# Patient Record
Sex: Male | Born: 1960 | Race: White | Hispanic: No | Marital: Married | Smoking: Never smoker
Health system: Southern US, Community
[De-identification: ages and names within clinical notes are randomized; demographics above are authoritative.]

## PROBLEM LIST (undated history)

## (undated) DIAGNOSIS — T7840XA Allergy, unspecified, initial encounter: Secondary | ICD-10-CM

## (undated) DIAGNOSIS — E785 Hyperlipidemia, unspecified: Secondary | ICD-10-CM

## (undated) DIAGNOSIS — K219 Gastro-esophageal reflux disease without esophagitis: Secondary | ICD-10-CM

## (undated) HISTORY — DX: Allergy, unspecified, initial encounter: T78.40XA

## (undated) HISTORY — DX: Hyperlipidemia, unspecified: E78.5

## (undated) HISTORY — DX: Gastro-esophageal reflux disease without esophagitis: K21.9

## (undated) HISTORY — PX: ROTATOR CUFF REPAIR: SHX139

## (undated) HISTORY — PX: COLONOSCOPY: SHX174

---

## 2003-06-21 ENCOUNTER — Encounter: Payer: Self-pay | Admitting: Internal Medicine

## 2004-06-16 ENCOUNTER — Ambulatory Visit: Payer: Self-pay | Admitting: Internal Medicine

## 2004-08-19 ENCOUNTER — Ambulatory Visit: Payer: Self-pay | Admitting: Internal Medicine

## 2005-03-14 ENCOUNTER — Ambulatory Visit: Payer: Self-pay | Admitting: Family Medicine

## 2005-03-27 ENCOUNTER — Ambulatory Visit: Payer: Self-pay | Admitting: Internal Medicine

## 2005-09-21 ENCOUNTER — Ambulatory Visit: Payer: Self-pay | Admitting: Family Medicine

## 2005-09-23 ENCOUNTER — Ambulatory Visit: Payer: Self-pay | Admitting: Internal Medicine

## 2006-05-10 ENCOUNTER — Encounter: Payer: Self-pay | Admitting: Internal Medicine

## 2006-05-21 ENCOUNTER — Ambulatory Visit: Payer: Self-pay | Admitting: Allergy and Immunology

## 2006-06-09 ENCOUNTER — Encounter: Payer: Self-pay | Admitting: Internal Medicine

## 2006-08-10 ENCOUNTER — Encounter: Payer: Self-pay | Admitting: Internal Medicine

## 2006-10-23 ENCOUNTER — Ambulatory Visit: Payer: Self-pay | Admitting: Family Medicine

## 2006-10-23 ENCOUNTER — Encounter: Payer: Self-pay | Admitting: Internal Medicine

## 2007-12-08 ENCOUNTER — Ambulatory Visit: Payer: Self-pay | Admitting: Family Medicine

## 2007-12-08 DIAGNOSIS — H60399 Other infective otitis externa, unspecified ear: Secondary | ICD-10-CM | POA: Insufficient documentation

## 2007-12-08 DIAGNOSIS — H6690 Otitis media, unspecified, unspecified ear: Secondary | ICD-10-CM | POA: Insufficient documentation

## 2008-02-15 ENCOUNTER — Ambulatory Visit: Payer: Self-pay | Admitting: Internal Medicine

## 2008-02-15 DIAGNOSIS — R109 Unspecified abdominal pain: Secondary | ICD-10-CM | POA: Insufficient documentation

## 2008-02-16 ENCOUNTER — Encounter: Payer: Self-pay | Admitting: Internal Medicine

## 2008-02-16 LAB — CONVERTED CEMR LAB
Albumin: 4.2 g/dL (ref 3.5–5.2)
Alkaline Phosphatase: 70 units/L (ref 39–117)
BUN: 15 mg/dL (ref 6–23)
Basophils Absolute: 0 10*3/uL (ref 0.0–0.1)
Bilirubin, Direct: 0.1 mg/dL (ref 0.0–0.3)
CO2: 31 meq/L (ref 19–32)
Chloride: 107 meq/L (ref 96–112)
Creatinine, Ser: 1 mg/dL (ref 0.4–1.5)
Eosinophils Absolute: 0.2 10*3/uL (ref 0.0–0.7)
GFR calc non Af Amer: 86 mL/min
Lipase: 21 units/L (ref 11.0–59.0)
MCHC: 34 g/dL (ref 30.0–36.0)
MCV: 89.3 fL (ref 78.0–100.0)
Neutrophils Relative %: 55.3 % (ref 43.0–77.0)
Platelets: 213 10*3/uL (ref 150–400)
Potassium: 4.2 meq/L (ref 3.5–5.1)

## 2008-05-11 ENCOUNTER — Ambulatory Visit: Payer: Self-pay | Admitting: Internal Medicine

## 2008-05-11 DIAGNOSIS — K2970 Gastritis, unspecified, without bleeding: Secondary | ICD-10-CM | POA: Insufficient documentation

## 2008-05-11 DIAGNOSIS — E785 Hyperlipidemia, unspecified: Secondary | ICD-10-CM | POA: Insufficient documentation

## 2008-05-11 DIAGNOSIS — K2990 Gastroduodenitis, unspecified, without bleeding: Secondary | ICD-10-CM

## 2008-05-11 LAB — CONVERTED CEMR LAB
Glucose, Urine, Semiquant: NEGATIVE
Specific Gravity, Urine: 1.015
Urobilinogen, UA: 0.2
WBC Urine, dipstick: NEGATIVE
pH: 6

## 2008-06-21 ENCOUNTER — Ambulatory Visit: Payer: Self-pay | Admitting: Internal Medicine

## 2008-06-21 DIAGNOSIS — K219 Gastro-esophageal reflux disease without esophagitis: Secondary | ICD-10-CM

## 2008-06-29 ENCOUNTER — Ambulatory Visit: Payer: Self-pay | Admitting: Family Medicine

## 2008-06-29 DIAGNOSIS — M25519 Pain in unspecified shoulder: Secondary | ICD-10-CM

## 2008-06-29 DIAGNOSIS — M719 Bursopathy, unspecified: Secondary | ICD-10-CM

## 2008-06-29 DIAGNOSIS — M67919 Unspecified disorder of synovium and tendon, unspecified shoulder: Secondary | ICD-10-CM | POA: Insufficient documentation

## 2008-08-03 ENCOUNTER — Ambulatory Visit: Payer: Self-pay | Admitting: Family Medicine

## 2008-10-25 ENCOUNTER — Ambulatory Visit (HOSPITAL_BASED_OUTPATIENT_CLINIC_OR_DEPARTMENT_OTHER): Admission: RE | Admit: 2008-10-25 | Discharge: 2008-10-25 | Payer: Self-pay | Admitting: Orthopedic Surgery

## 2009-01-24 IMAGING — CT CT PARANASAL SINUSES W/O CM
1 series · 16 of 24 positions shown, 20 images · non-contrast
Comparison: none

REASON FOR EXAM: chronic sinusitis
COMMENTS:

[Series 2: sinus · axial · 0.20mm/px · z∈[-113,-8]mm · 16 of 24 slices shown, 20 images]
[im 2/24  brain]
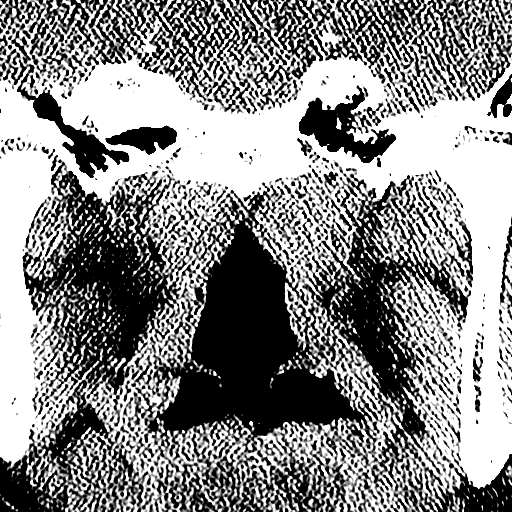
[im 2/24  bone]
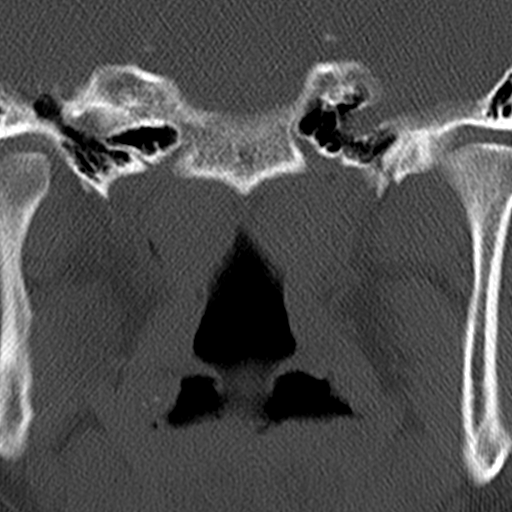
[im 4/24  bone]
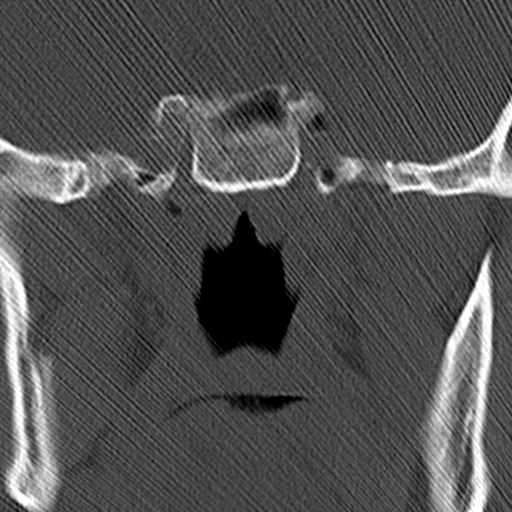
[im 5/24  bone]
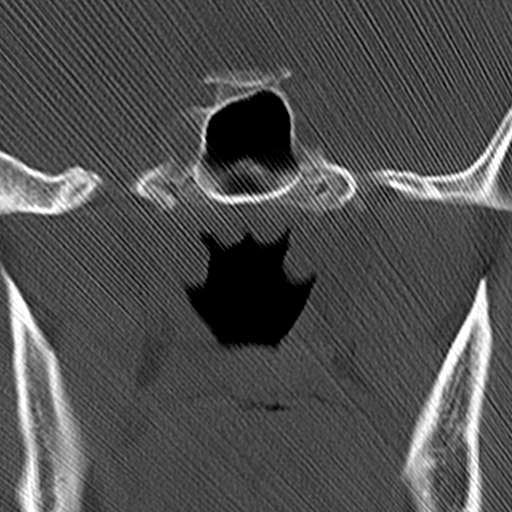
[im 6/24  bone]
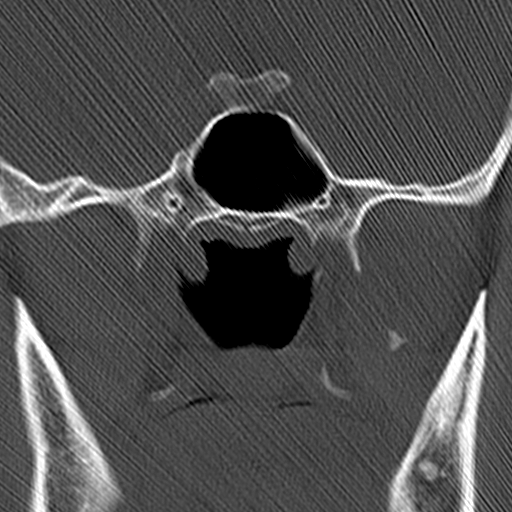
[im 8/24  brain]
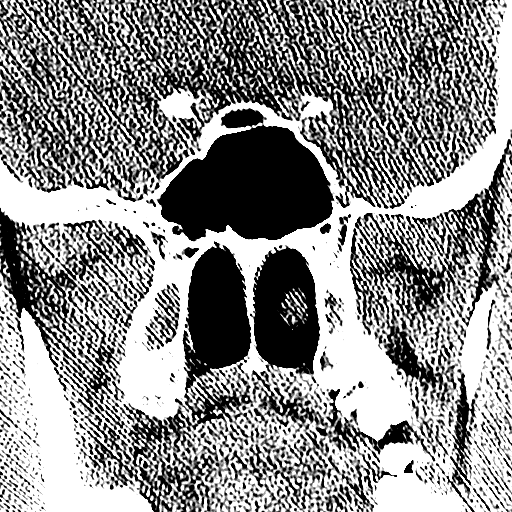
[im 8/24  bone]
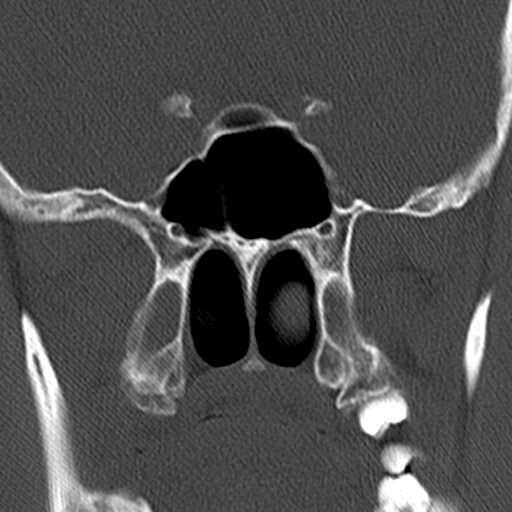
[im 9/24  bone]
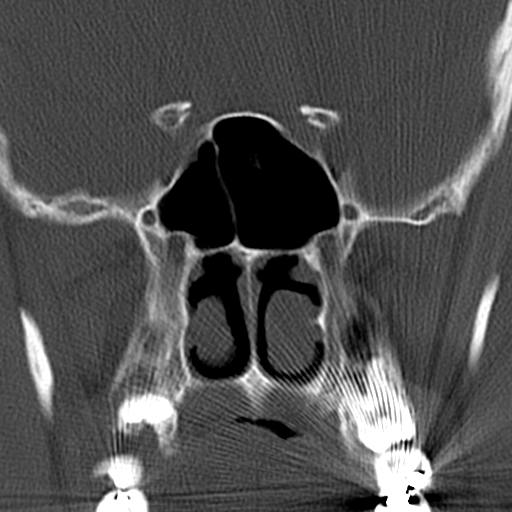
[im 10/24  bone]
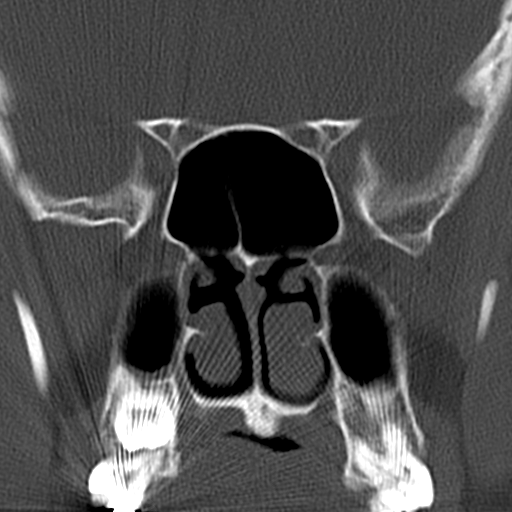
[im 12/24  bone]
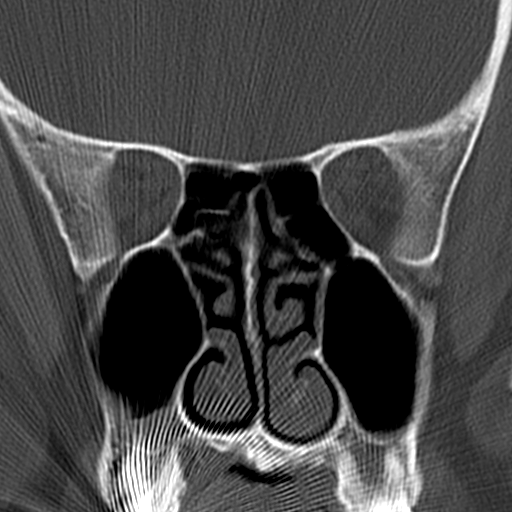
[im 13/24  brain]
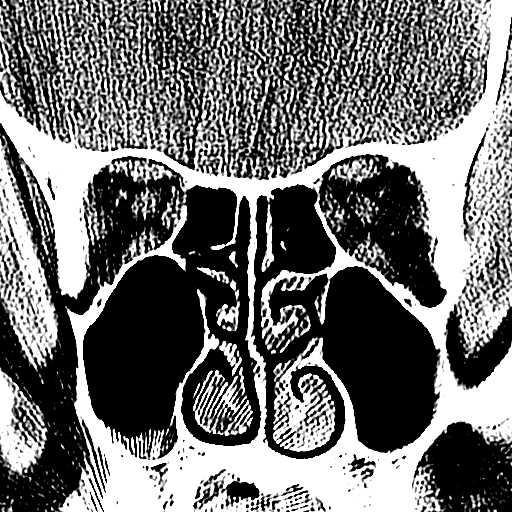
[im 13/24  bone]
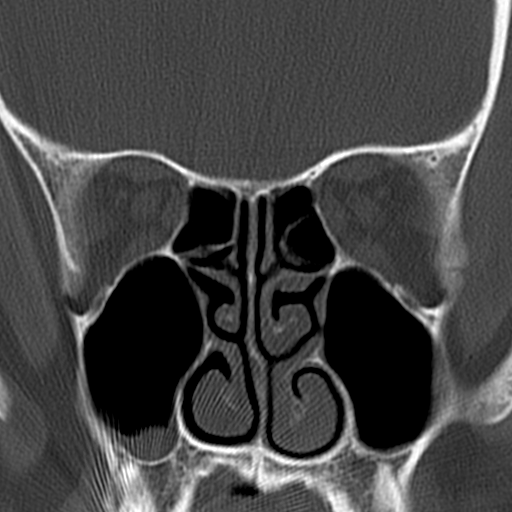
[im 15/24  bone]
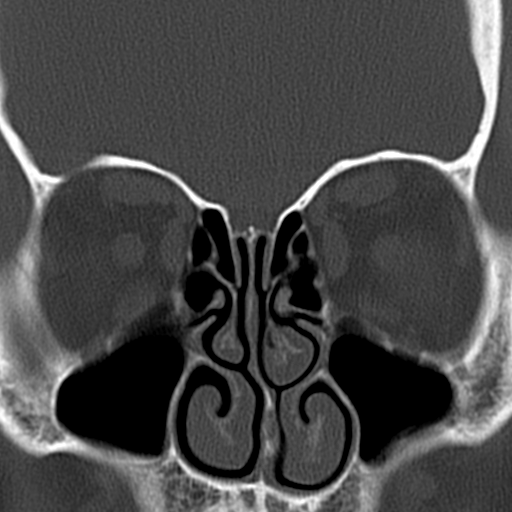
[im 16/24  bone]
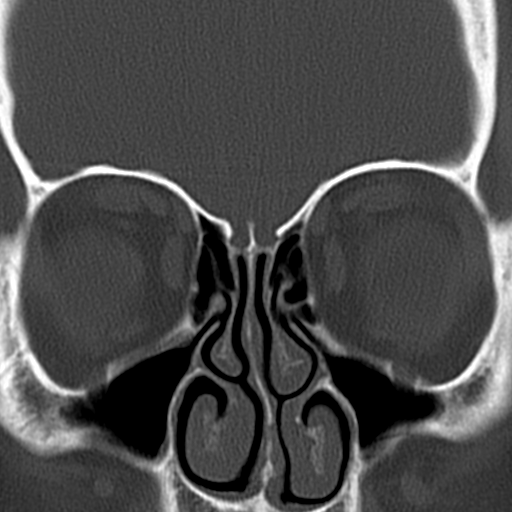
[im 17/24  bone]
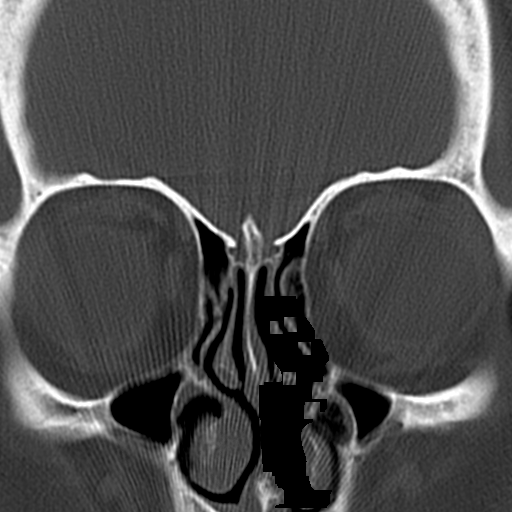
[im 19/24  brain]
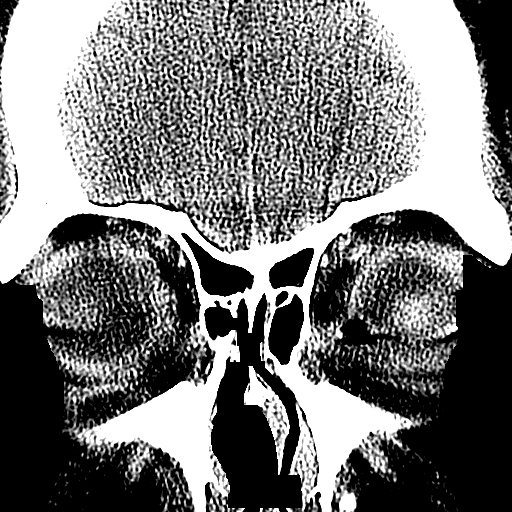
[im 19/24  bone]
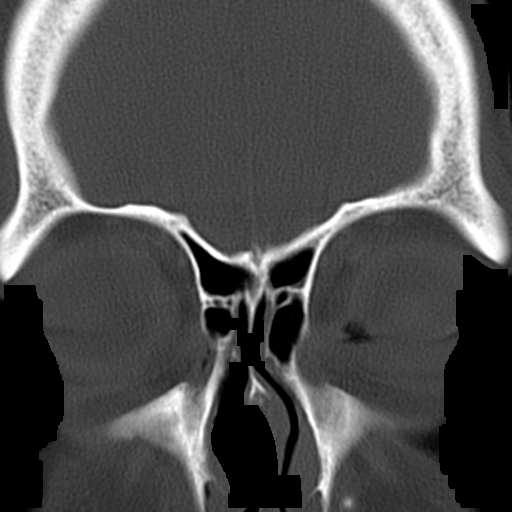
[im 20/24  bone]
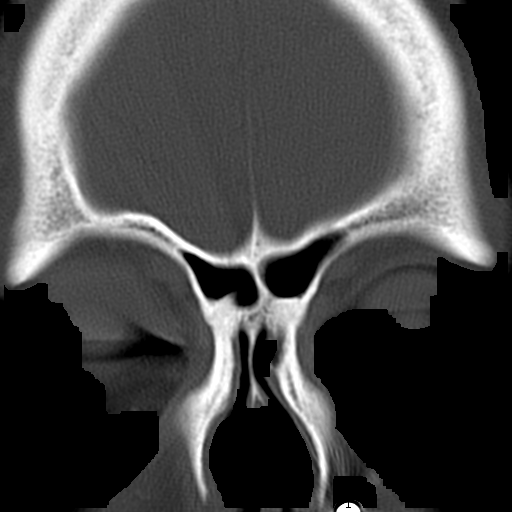
[im 21/24  bone]
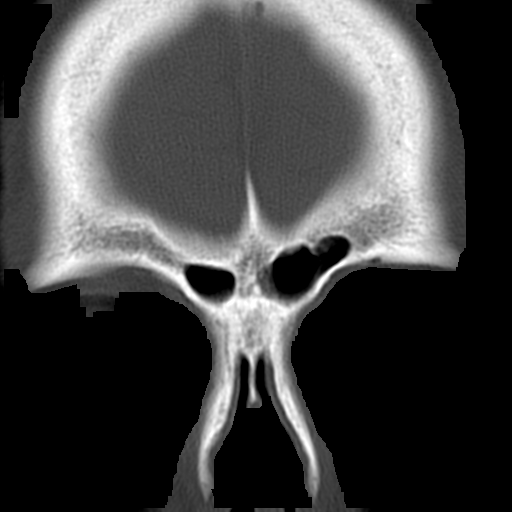
[im 23/24  bone]
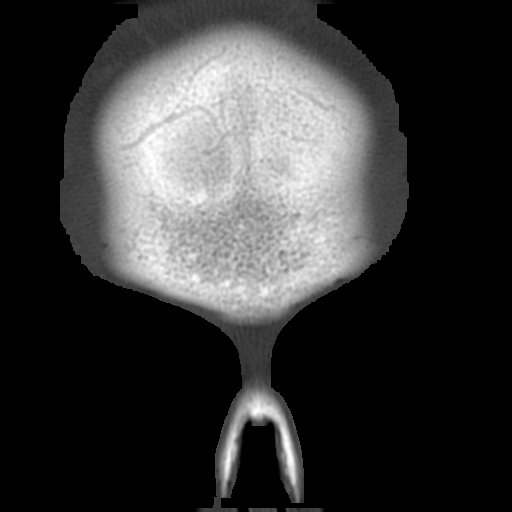

[16 of 24 positions shown; findings below may reference images not displayed]

PROCEDURE:     CT  - CT SINUSES WITHOUT CONTRAST  - May 21, 2006  [DATE]

RESULT:

Coronal images were obtained of the paranasal sinuses.

The paranasal sinuses are well aerated. An area of low attenuation projects
within the base of the RIGHT maxillary sinus which has a somewhat bulbous
configuration likely representing a mucous retention cyst or possibly a
polyp. Otherwise there is no evidence of mucosal thickening. The ostiomeatal
complexes are patent and the turbinates appear unremarkable.
IMPRESSION: Findings likely representing the sequela of a mucous retention cyst or polyp
within the RIGHT maxillary sinus. Otherwise no CT evidence to suggest
sinusitis.

## 2010-05-07 ENCOUNTER — Ambulatory Visit (INDEPENDENT_AMBULATORY_CARE_PROVIDER_SITE_OTHER): Payer: BC Managed Care – PPO | Admitting: Family Medicine

## 2010-05-07 ENCOUNTER — Encounter: Payer: Self-pay | Admitting: Family Medicine

## 2010-05-07 DIAGNOSIS — J209 Acute bronchitis, unspecified: Secondary | ICD-10-CM

## 2010-05-08 ENCOUNTER — Telehealth: Payer: Self-pay | Admitting: Family Medicine

## 2010-05-13 NOTE — Progress Notes (Signed)
Summary: ? pink eye  Phone Note Call from Patient Call back at Home Phone [phone]   Caller: Cathie Olden Call For: Dr. Sharen Hones Summary of Call: Pt was seen yesterday.  Today he has developed ? conjunctivitis- eye is red, itchy, crusty since this morning.  Wife is asking if something can be called in or does pt need to be seen again?  Uses [facility]. Initial call taken by: Lowella Petties CMA, AAMA,  May 08, 2010 11:08 AM  Follow-up for Phone Call        ensure no pain with eye movements, no vision changes.   sounds like viral conjunctivitis.  recommend warm compresses to eye three times a day as well as lubricating eye drops (otc).   if not better in 2-3 days or worsening discharge, can fill abx eye drop to be sent in. Follow-up by: Eustaquio Boyden  MD,  May 08, 2010 12:56 PM  Additional Follow-up for Phone Call Additional follow up Details #1::        Left message on home machine to return my call. No answer on work #. Kim Dance CMA Duncan Dull)  May 08, 2010 2:21 PM   Pt instructed as above, drops sent to [facility] pharmacy. Additional Follow-up by: Lowella Petties CMA, AAMA,  May 08, 2010 3:54 PM    New/Updated Medications: POLYTRIM 10000-0.1 UNIT/ML-% SOLN (POLYMYXIN B-TRIMETHOPRIM) one drop to affected eye qid x 5 days Prescriptions: POLYTRIM 10000-0.1 UNIT/ML-% SOLN (POLYMYXIN B-TRIMETHOPRIM) one drop to affected eye qid x 5 days  #1 x 0   Entered and Authorized by:   Eustaquio Boyden  MD   Signed by:   Lowella Petties CMA, AAMA on 05/08/2010   Method used:   Electronically to        [facility]* (retail)       [address]       [place], [place]  [address]       Ph: [phone]       Fax: [phone]   RxID:   [id number]

## 2010-05-13 NOTE — Assessment & Plan Note (Signed)
Summary: COUGH, CHEST IS TIGHT   Vital Signs:  Patient profile:   50 year old male Weight:      170.25 pounds BMI:     26.76 Temp:     99.4 degrees F oral Pulse rate:   80 / minute Pulse rhythm:   regular BP sitting:   110 / 70  (left arm) Cuff size:   regular  Vitals Entered By: Selena Batten Dance CMA (AAMA) (May 07, 2010 9:26 AM) CC: Cough,sinus pressure,chest tightness with coughing Comments OTC meds no help   History of Present Illness: CC: cough, sinus pressure  4d h/o sinus pressure, cough, vomiting (phlegm, posttussive) with dry heaves.  Now chest sore with coughing.  taking expectorant which helps sputum production as well as OTC multisxs daytime medicine.  Thinks getting better.  + ST as well.  + low grade fever, now resolved.  + sinus pressure pain behind eyes.  + L ear pain as well.  Cough productive of mild sputum that started this morning.  No abd pain, n/d, rashes, myalgias, arthralgias.    illness went through the house.  No smokers at home, no h/o asthma.  thinks may be getting better.  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 No apparent alopecia or balding.  no sinus tenderness Eyes:  PERRLA, EOMI, no injection Ears:  TMs clear bilaterally Nose:  + discharge bilateral nostrils Mouth:  MMM, no pharyngeal erythema Neck:  supple, no masses, no thyromegaly, no carotid bruits, and no cervical lymphadenopathy.   Lungs:  Normal  respiratory effort, chest expands symmetrically. Lungs are clear to auscultation, no crackles or wheezes. Heart:  normal rate, regular rhythm, no murmur, and no gallop.   Pulses:  2+ rad pulses, brisk cap refill Extremities:  no pedal edema   Impression & Recommendations:  Problem # 1:  ACUTE BRONCHITIS (ICD-466.0) with posttussive emesis.  as seems to be improving, suportive care for now.  pt declines cough med.  advised to call us if deteriorating or if not improving by friday for consideration of abx (cover atypicals.)  Patient Instructions: 1)  Sounds like you have a viral upper respiratory infection. 2)  Antibiotics are not needed for this.  Viral infections usually take 7-10 days to resolve.  The cough can last 4 weeks to go away. 3)  Continue plenty of fluids and rest. 4)  If not better into Friday, give [place] a call. 5)  Continue expectorant, consider nasal saline spray for sinuses. 6)  Please return if you are not improving as expected, or if you have high fevers (>101.5) or difficulty swallowing. 7)  Call clinic with questions.  Pleasure to see you today!    Orders Added: 1)  Est. Patient Level III [25366]    Prior Medications: Current Allergies (reviewed today): No known allergies

## 2010-06-22 LAB — POCT HEMOGLOBIN-HEMACUE: Hemoglobin: 17.7 g/dL — ABNORMAL HIGH (ref 13.0–17.0)

## 2010-07-29 NOTE — Op Note (Signed)
NAME:  Matthew Walter, FRISCH                 ACCOUNT NO.:  [id number]   MEDICAL RECORD NO.:  [id number]          PATIENT TYPE:  AMB   LOCATION:  DSC                          FACILITY:  [facility]   PHYSICIAN:  Rodney A. Mortenson, M.D.DATE OF BIRTH:  1960-08-01   DATE OF PROCEDURE:  10/25/2008  DATE OF DISCHARGE:                               OPERATIVE REPORT   JUSTIFICATION:  A 50 year old male referred to me by Dr. Crista Luria  from [facility] for an injury he sustained to his  right shoulder in September 27, 2008 when he was lifting and pushing a box.  Because of persistent pain and discomfort, he was noted to have some  tenderness of the [facility] joint, cross-arm test, empty can test was positive,  impingement test is very painful.  Because of persistent pain and  discomfort, an MRI of the shoulder was done and this shows severe  osteoarthritis of the [facility] joint with undersurface spurring with mass  effects of supraspinatus and nearly complete tear of the supraspinatus  of 2 cm of retraction.  Because of persistent pain and discomfort which  was not responding, the patient was scheduled for surgery and  reconstruction.  Complication was discussed preoperatively.  Questions  were answered and encouraged and the patient wishes to proceed.   JUSTIFICATION FOR OUTPATIENT SURGERY:  Minimal morbidity.   PREOPERATIVE DIAGNOSIS:  Full-thickness rotator cuff tear of right  shoulder; osteoarthritis of right acromioclavicular joint.   POSTOPERATIVE DIAGNOSIS:  Full-thickness rotator cuff tear of right  shoulder; osteoarthritis of right acromioclavicular joint.   OPERATION:  Diagnostic arthroscopy of right shoulder; open subacromial  decompression; open repair of retracted rotator cuff tear, right  shoulder; resection of right distal clavicle.   SURGEON:  Lenard Galloway. Chaney Malling, MD   ASSISTANT:  Oris Drone. Petrarca, PA-C   PROCEDURE:  The patient was placed on the operating table in supine  position.  After satisfactory general anesthesia, the patient was placed  in semi-sitting position.  The right upper extremity was prepped with  DuraPrep and draped down in the usual manner.  The arthroscope was  introduced in the posterior portal and anterior portal was made for  surgical approach.  A very careful examination of the joint was  undertaken.  The glenoid appeared normal as did the humeral head.  There  was normal articular cartilage.  The labrum was intact.  The anchor was  intact.  The biceps appeared normal.  Following the biceps as it exit  the shoulder.  There was a large full-thickness tear of the rotator  cuff.  The arthroscope could be passed through the tear into the  subacromial space and the undersurface of the acromion was clearly  visualized.  There was retraction of the tear and this was actually very  thick portion and not frayed or fibrillated.  No other significant  pathology seen in glenohumeral joint and the arthroscope was removed.   A saber-cut incision made over the anterolateral aspect of the shoulder.  Skin edges were retracted.  Bleeders were coagulated.  Deltoid fibers  were split longitudinally  with a finger and a self-retaining retractor  was put in place.  There was a very large thickened bursa, which was  excised and there was exposed rotator cuff tear.  The tear was about a  centimeter and half wide and retracted about a centimeter.  The normal  footprint could be seen.  A rongeur was used to freshen up the  footprint.  The cuff tear was quite mobile, can be brought down into an  anatomic position.  A PEEK anchor system was then used and a hole was  drilled in the humeral head and a 5.5 PEEK anchor inserted and two loops  of the FiberWire were brought through the distal tendinous portion and  the tendon brought down to the footprint and sutured down extremely  well.  There was an excellent complete coverage of the footprint with  good  stability.  The ends of the FiberWire were preserved and needles  were removed.  A PushLock was then used and another hole was placed  distal to the PEEK anchor and the ends of the FiberWire were brought  through the PushLock and this was driven down into the humerus and that  was brought the last portion of the rotator cuff down into an anatomic  position with a very smooth transition.  I was very pleased with the  surgical construct, stability, the range of motion, and the lack of  impingement.  An anterior acromioplasty had been done as we were getting  into the shoulder and range of motion showed the repair cleared very  nicely with no impingement.  Throughout the procedure, the shoulder was  irrigated with copious amounts of saline solution.  Attention was then  turned to the [facility] joint, which was identified with an 18-gauge needle.  The [facility] joint was opened and the distal end of clavicle was markedly  eroded and had a cobblestone appearance.  A power saw was used in about  8 mm of distal clavicle where it was resected.  Great care was taken to  avoid any injury to the adjacent inferior capsule or ligaments.  It was  quite stable once the portion of the clavicle was removed.  Bleeders  were coagulated.  The soft tissue capsule was then involuted into the  resected area and sutured with 0 Vicryl.  The 0 Vicryl was then used to  approximate the deltoid fibers together.  Subcutaneous Vicryl sutures  were used and Steri-Strips was used to close the skin.  Sterile  dressings were applied and the patient was then returned recovery room  in excellent condition.  Technically, this went extremely well.   DISPOSITION:  1. Usual postop instructions.  2. Sling.  3. Percocet for pain.  4. To my office on Wednesday.      Rodney A. Chaney Malling, M.D.  Electronically Signed     RAM/MEDQ  D:  10/25/2008  T:  10/25/2008  Job:  [id number]   cc:   Sharlet Salina, M.D.

## 2012-09-13 ENCOUNTER — Ambulatory Visit (INDEPENDENT_AMBULATORY_CARE_PROVIDER_SITE_OTHER): Payer: BC Managed Care – PPO | Admitting: Internal Medicine

## 2012-09-13 ENCOUNTER — Encounter: Payer: Self-pay | Admitting: Internal Medicine

## 2012-09-13 VITALS — BP 112/78 | HR 79 | Temp 98.5°F | Ht 67.5 in | Wt 172.0 lb

## 2012-09-13 DIAGNOSIS — E785 Hyperlipidemia, unspecified: Secondary | ICD-10-CM

## 2012-09-13 DIAGNOSIS — Z125 Encounter for screening for malignant neoplasm of prostate: Secondary | ICD-10-CM

## 2012-09-13 DIAGNOSIS — Z0000 Encounter for general adult medical examination without abnormal findings: Secondary | ICD-10-CM

## 2012-09-13 DIAGNOSIS — Z1211 Encounter for screening for malignant neoplasm of colon: Secondary | ICD-10-CM

## 2012-09-13 LAB — BASIC METABOLIC PANEL
BUN: 14 mg/dL (ref 6–23)
CO2: 26 mEq/L (ref 19–32)
Chloride: 103 mEq/L (ref 96–112)
Glucose, Bld: 76 mg/dL (ref 70–99)
Potassium: 4.3 mEq/L (ref 3.5–5.1)
Sodium: 138 mEq/L (ref 135–145)

## 2012-09-13 LAB — CBC WITH DIFFERENTIAL/PLATELET
Basophils Absolute: 0 10*3/uL (ref 0.0–0.1)
HCT: 51.2 % (ref 39.0–52.0)
Hemoglobin: 17.1 g/dL — ABNORMAL HIGH (ref 13.0–17.0)
Lymphs Abs: 2.8 10*3/uL (ref 0.7–4.0)
MCHC: 33.5 g/dL (ref 30.0–36.0)
MCV: 89.4 fl (ref 78.0–100.0)
Monocytes Absolute: 0.8 10*3/uL (ref 0.1–1.0)
Neutro Abs: 5.5 10*3/uL (ref 1.4–7.7)
Platelets: 215 10*3/uL (ref 150.0–400.0)
RDW: 14.1 % (ref 11.5–14.6)

## 2012-09-13 LAB — HEPATIC FUNCTION PANEL
Albumin: 4.6 g/dL (ref 3.5–5.2)
Alkaline Phosphatase: 82 U/L (ref 39–117)
Total Protein: 7.7 g/dL (ref 6.0–8.3)

## 2012-09-13 LAB — PSA: PSA: 1.75 ng/mL (ref 0.10–4.00)

## 2012-09-13 LAB — TSH: TSH: 1.54 u[IU]/mL (ref 0.35–5.50)

## 2012-09-13 LAB — LIPID PANEL: HDL: 46.7 mg/dL (ref >39.00)

## 2012-09-13 NOTE — Assessment & Plan Note (Signed)
Healthy Will do every 2 year PSA after discussion colonoscopy

## 2012-09-13 NOTE — Assessment & Plan Note (Signed)
Haven't checked in some time Will check labs

## 2012-09-13 NOTE — Progress Notes (Signed)
Subjective:    Patient ID: Matthew Walter, male    DOB: 1960-06-08, 52 y.o.   MRN: [id number]  HPI Here for physical Has been doing well Had both rotator cuffs repaired in past 4 years---they are doing well  Active at work Not much exercise otherwise---- busy with kids  No concerns  No current outpatient prescriptions on file prior to visit.   No current facility-administered medications on file prior to visit.    No Known Allergies  Past Medical History  Diagnosis Date  . Hyperlipidemia   . GERD (gastroesophageal reflux disease)     Past Surgical History  Procedure Laterality Date  . Rotator cuff repair Bilateral 2010--R, 2012-- L    Dr Chaney Malling and then Dr Thomasena Edis    Family History  Problem Relation Age of Onset  . Healthy Mother   . Cancer Maternal Grandmother   . Cancer Maternal Grandfather   . Hypertension Neg Hx   . Diabetes Neg Hx     History   Social History  . Marital Status: Married    Spouse Name: N/A    Number of Children: 4  . Years of Education: N/A   Occupational History  . Drives for [employer]    Social History Main Topics  . Smoking status: Never Smoker   . Smokeless tobacco: Never Used  . Alcohol Use: Yes  . Drug Use: No  . Sexually Active: Not on file   Other Topics Concern  . Not on file   Social History Narrative  . No narrative on file   Review of Systems  Constitutional: Negative for fatigue and unexpected weight change.       Wears seat belt  HENT: Positive for congestion and rhinorrhea. Negative for hearing loss, dental problem and tinnitus.        Regular with dentist   Eyes: Negative for visual disturbance.       No diplopia or unilateral vision change  Respiratory: Negative for cough, chest tightness and shortness of breath.   Cardiovascular: Negative for chest pain, palpitations and leg swelling.  Gastrointestinal: Positive for constipation. Negative for nausea, vomiting, abdominal pain and blood in stool.       Occ  heartburn if eats wrong-- maalox will help Constipation if eating too much fast food  Endocrine: Negative for cold intolerance and heat intolerance.  Genitourinary: Positive for difficulty urinating. Negative for urgency and frequency.       Occ delay in initiation Occ dribbling No regular nocturia No sexual problems  Musculoskeletal: Positive for back pain. Negative for joint swelling and arthralgias.       Sees chiropractor twice a month for chronic back problems ([place])  Skin: Positive for rash.       Gets occasional acne on face No suspicious lesions Sees Dr Terri Piedra regularly  Allergic/Immunologic: Positive for environmental allergies. Negative for immunocompromised state.       Saw Dr Gary Fleet--- uses zyrtec prn  Neurological: Positive for headaches. Negative for dizziness, syncope, weakness, light-headedness and numbness.       Occ sinus headaches--- will use advil with success  Hematological: Negative for adenopathy. Does not bruise/bleed easily.  Psychiatric/Behavioral: Negative for sleep disturbance and dysphoric mood. The patient is not nervous/anxious.        Objective:   Physical Exam  Constitutional: He is oriented to person, place, and time. He appears well-developed and well-nourished. No distress.  HENT:  Head: Normocephalic and atraumatic.  Right Ear: External ear normal.  Left  Ear: External ear normal.  Mouth/Throat: Oropharynx is clear and moist. No oropharyngeal exudate.  Eyes: Conjunctivae and EOM are normal. Pupils are equal, round, and reactive to light.  Neck: Normal range of motion. Neck supple. No thyromegaly present.  Cardiovascular: Normal rate, regular rhythm, normal heart sounds and intact distal pulses.  Exam reveals no gallop.   No murmur heard. Pulmonary/Chest: Effort normal and breath sounds normal. No respiratory distress. He has no wheezes. He has no rales.  Abdominal: Soft. There is no tenderness.  Musculoskeletal: He exhibits no edema and no  tenderness.  Lymphadenopathy:    He has no cervical adenopathy.  Neurological: He is alert and oriented to person, place, and time.  Skin: No rash noted. No erythema.  Psychiatric: He has a normal mood and affect. His behavior is normal.          Assessment & Plan:

## 2012-09-14 ENCOUNTER — Encounter: Payer: Self-pay | Admitting: *Deleted

## 2012-09-20 ENCOUNTER — Encounter: Payer: Self-pay | Admitting: Internal Medicine

## 2012-12-30 ENCOUNTER — Encounter: Payer: Self-pay | Admitting: Internal Medicine

## 2013-01-19 ENCOUNTER — Other Ambulatory Visit: Payer: Self-pay

## 2013-03-13 ENCOUNTER — Ambulatory Visit (AMBULATORY_SURGERY_CENTER): Payer: BC Managed Care – PPO

## 2013-03-13 VITALS — Ht 67.0 in | Wt 172.0 lb

## 2013-03-13 DIAGNOSIS — Z1211 Encounter for screening for malignant neoplasm of colon: Secondary | ICD-10-CM

## 2013-03-13 MED ORDER — MOVIPREP 100 G PO SOLR: 1.0000 | Freq: Once | ORAL | Status: DC

## 2013-03-14 ENCOUNTER — Encounter: Payer: Self-pay | Admitting: Internal Medicine

## 2013-03-20 ENCOUNTER — Ambulatory Visit (AMBULATORY_SURGERY_CENTER): Payer: BC Managed Care – PPO | Admitting: Internal Medicine

## 2013-03-20 ENCOUNTER — Encounter: Payer: Self-pay | Admitting: Internal Medicine

## 2013-03-20 VITALS — BP 121/74 | HR 64 | Temp 98.2°F | Resp 16 | Ht 67.0 in | Wt 172.0 lb

## 2013-03-20 DIAGNOSIS — D126 Benign neoplasm of colon, unspecified: Secondary | ICD-10-CM

## 2013-03-20 DIAGNOSIS — Z1211 Encounter for screening for malignant neoplasm of colon: Secondary | ICD-10-CM

## 2013-03-20 MED ORDER — SODIUM CHLORIDE 0.9 % IV SOLN: 500.0000 mL | INTRAVENOUS | Status: DC

## 2013-03-20 NOTE — Op Note (Signed)
[facility]  [address] [place], [id number]   COLONOSCOPY PROCEDURE REPORT  PATIENT: Matthew Walter, Metzgar  MR#: [id number] BIRTHDATE: [birth year] , 52  yrs. old GENDER: Male ENDOSCOPIST: Eustace Quail, MD REFERRED TZ:GYFVCBS Silvio Pate, M.D. PROCEDURE DATE:  03/20/2013 PROCEDURE:   Colonoscopy with snare polypectomy x 1 First Screening Colonoscopy - Avg.  risk and is 50 yrs.  old or older Yes.  Prior Negative Screening - Now for repeat screening. N/A  History of Adenoma - Now for follow-up colonoscopy & has been > or = to 3 yrs.  N/A  Polyps Removed Today? Yes. ASA CLASS:   Class I INDICATIONS:average risk screening. MEDICATIONS: MAC sedation, administered by CRNA and propofol (Diprivan) 380mg  IV  DESCRIPTION OF PROCEDURE:   After the risks benefits and alternatives of the procedure were thoroughly explained, informed consent was obtained.  A digital rectal exam revealed no abnormalities of the rectum.   The LB [id number]  endoscope was introduced through the anus and advanced to the cecum, which was identified by both the appendix and ileocecal valve. No adverse events experienced.   The quality of the prep was excellent, using MoviPrep  The instrument was then slowly withdrawn as the colon was fully examined.    COLON FINDINGS: A diminutive polyp was found in the ascending colon. A polypectomy was performed with a cold snare.  The resection was complete and the polyp tissue was completely retrieved.   The colon was otherwise normal.  There was no diverticulosis, inflammation,other polyps or cancers .  Retroflexed views revealed internal hemorrhoids. The time to cecum=3 minutes 53 seconds. Withdrawal time=11 minutes 04 seconds.  The scope was withdrawn and the procedure completed. COMPLICATIONS: There were no complications.  ENDOSCOPIC IMPRESSION: 1.   Diminutive polyp was found in the ascending colon; polypectomy was performed with a cold snare 2.    The colon was otherwise normal  RECOMMENDATIONS: 1. Repeat colonoscopy in 5 years if polyp adenomatous; otherwise 10 years   eSigned:  Eustace Quail, MD 03/20/2013 9:57 AM   cc: Venia Carbon, MD and The Patient

## 2013-03-20 NOTE — Patient Instructions (Signed)
YOU HAD AN ENDOSCOPIC PROCEDURE TODAY AT THE [facility]: Refer to the procedure report that was given to you for any specific questions about what was found during the examination.  If the procedure report does not answer your questions, please call your gastroenterologist to clarify.  If you requested that your care partner not be given the details of your procedure findings, then the procedure report has been included in a sealed envelope for you to review at your convenience later.  YOU SHOULD EXPECT: Some feelings of bloating in the abdomen. Passage of more gas than usual.  Walking can help get rid of the air that was put into your GI tract during the procedure and reduce the bloating. If you had a lower endoscopy (such as a colonoscopy or flexible sigmoidoscopy) you may notice spotting of blood in your stool or on the toilet paper. If you underwent a bowel prep for your procedure, then you may not have a normal bowel movement for a few days.  DIET: Your first meal following the procedure should be a light meal and then it is ok to progress to your normal diet.  A half-sandwich or bowl of soup is an example of a good first meal.  Heavy or fried foods are harder to digest and may make you feel nauseous or bloated.  Likewise meals heavy in dairy and vegetables can cause extra gas to form and this can also increase the bloating.  Drink plenty of fluids but you should avoid alcoholic beverages for 24 hours.  ACTIVITY: Your care partner should take you home directly after the procedure.  You should plan to take it easy, moving slowly for the rest of the day.  You can resume normal activity the day after the procedure however you should NOT DRIVE or use heavy machinery for 24 hours (because of the sedation medicines used during the test).    SYMPTOMS TO REPORT IMMEDIATELY: A gastroenterologist can be reached at any hour.  During normal business hours, 8:30 AM to 5:00 PM Monday through Friday,  call [phone].  After hours and on weekends, please call the GI answering service at [phone] who will take a message and have the physician on call contact you.   Following lower endoscopy (colonoscopy or flexible sigmoidoscopy):  Excessive amounts of blood in the stool  Significant tenderness or worsening of abdominal pains  Swelling of the abdomen that is new, acute  Fever of 100F or higher    FOLLOW UP: If any biopsies were taken you will be contacted by phone or by letter within the next 1-3 weeks.  Call your gastroenterologist if you have not heard about the biopsies in 3 weeks.  Our staff will call the home number listed on your records the next business day following your procedure to check on you and address any questions or concerns that you may have at that time regarding the information given to you following your procedure. This is a courtesy call and so if there is no answer at the home number and we have not heard from you through the emergency physician on call, we will assume that you have returned to your regular daily activities without incident.  SIGNATURES/CONFIDENTIALITY: You and/or your care partner have signed paperwork which will be entered into your electronic medical record.  These signatures attest to the fact that that the information above on your After Visit Summary has been reviewed and is understood.  Full responsibility of the confidentiality   of this discharge information lies with you and/or your care-partner.     

## 2013-03-20 NOTE — Progress Notes (Signed)
Called to room to assist during endoscopic procedure.  Patient ID and intended procedure confirmed with present staff. Received instructions for my participation in the procedure from the performing physician.  

## 2013-03-20 NOTE — Progress Notes (Signed)
Report to pacu rn, vss, bbs=clear 

## 2013-03-21 ENCOUNTER — Telehealth: Payer: Self-pay

## 2013-03-21 NOTE — Telephone Encounter (Signed)
  Follow up Call-  Call back number 03/20/2013  Post procedure Call Back phone  # [phone]  Permission to leave phone message Yes     Patient questions:  Do you have a fever, pain , or abdominal swelling? no Pain Score  0 *  Have you tolerated food without any problems? yes  Have you been able to return to your normal activities? yes  Do you have any questions about your discharge instructions: Diet   no Medications  no Follow up visit  no  Do you have questions or concerns about your Care? no  Actions: * If pain score is 4 or above: No action needed, pain <4.

## 2013-03-23 ENCOUNTER — Encounter: Payer: Self-pay | Admitting: Internal Medicine

## 2016-06-17 ENCOUNTER — Ambulatory Visit: Payer: Self-pay | Admitting: Internal Medicine

## 2016-06-17 ENCOUNTER — Ambulatory Visit (INDEPENDENT_AMBULATORY_CARE_PROVIDER_SITE_OTHER): Payer: BLUE CROSS/BLUE SHIELD | Admitting: Family

## 2016-06-17 ENCOUNTER — Encounter: Payer: Self-pay | Admitting: Family

## 2016-06-17 VITALS — BP 136/88 | HR 68 | Temp 98.6°F | Ht 67.0 in | Wt 177.6 lb

## 2016-06-17 DIAGNOSIS — R509 Fever, unspecified: Secondary | ICD-10-CM

## 2016-06-17 NOTE — Patient Instructions (Signed)
Plenty of rest   Glad you are feeling so much better and let me know if you need anything else.

## 2016-06-17 NOTE — Progress Notes (Signed)
Subjective:    Patient ID: Matthew Walter, male    DOB: 1960/12/22, 56 y.o.   MRN: [id number]  CC: Matthew Walter is a 56 y.o. male who presents today for an acute visit.    HPI: CC: flu like symptoms started 7 days ago, resolved. Last fever 2 days ago, tmax 102.  Had felt achey 'all over', fever,loss of appetite, fatigue, nausea. Continues to have fatigue today and called out from work with [employer].   Had chicken and rice last night and thinks on the 'tail end.' Never had cough, congestion nor vomiting , abdominal pain  Daughter had been sick and had thinks he 'got from her.'       HISTORY:  Past Medical History:  Diagnosis Date  . GERD (gastroesophageal reflux disease)   . Hyperlipidemia    Past Surgical History:  Procedure Laterality Date  . ROTATOR CUFF REPAIR Bilateral 2010--R, 2012-- L   Dr Alphonzo Cruise and then Dr Theda Sers   Family History  Problem Relation Age of Onset  . Healthy Mother   . Cancer Maternal Grandmother   . Cancer Maternal Grandfather   . Hypertension Neg Hx   . Diabetes Neg Hx   . Colon cancer Neg Hx     Allergies: Patient has no known allergies. No current outpatient prescriptions on file prior to visit.   No current facility-administered medications on file prior to visit.     Social History  Substance Use Topics  . Smoking status: Never Smoker  . Smokeless tobacco: Never Used  . Alcohol use Yes    Review of Systems  Constitutional: Positive for chills and fever.  Respiratory: Negative for cough.   Cardiovascular: Negative for chest pain and palpitations.  Gastrointestinal: Negative for nausea and vomiting.  Musculoskeletal: Positive for myalgias.      Objective:    BP 136/88   Pulse 68   Temp 98.6 F (37 C) (Oral)   Ht 5\' 7"  (1.702 m)   Wt 177 lb 9.6 oz (80.6 kg)   SpO2 98%   BMI 27.82 kg/m   BP Readings from Last 3 Encounters:  06/17/16 136/88  03/20/13 121/74  09/13/12 112/78    Physical Exam  Constitutional: Vital  signs are normal. He appears well-developed and well-nourished.  HENT:  Head: Normocephalic and atraumatic.  Right Ear: Hearing, tympanic membrane, external ear and ear canal normal. No drainage, swelling or tenderness. Tympanic membrane is not injected, not erythematous and not bulging. No middle ear effusion. No decreased hearing is noted.  Left Ear: Hearing, tympanic membrane, external ear and ear canal normal. No drainage, swelling or tenderness. Tympanic membrane is not injected, not erythematous and not bulging.  No middle ear effusion. No decreased hearing is noted.  Nose: Nose normal. Right sinus exhibits no maxillary sinus tenderness and no frontal sinus tenderness. Left sinus exhibits no maxillary sinus tenderness and no frontal sinus tenderness.  Mouth/Throat: Uvula is midline, oropharynx is clear and moist and mucous membranes are normal. No oropharyngeal exudate, posterior oropharyngeal edema, posterior oropharyngeal erythema or tonsillar abscesses.  Eyes: Conjunctivae are normal.  Cardiovascular: Regular rhythm and normal heart sounds.   Pulmonary/Chest: Effort normal and breath sounds normal. No respiratory distress. He has no wheezes. He has no rhonchi. He has no rales.  Lymphadenopathy:       Head (right side): No submental, no submandibular, no tonsillar, no preauricular, no posterior auricular and no occipital adenopathy present.       Head (left side):  No submental, no submandibular, no tonsillar, no preauricular, no posterior auricular and no occipital adenopathy present.    He has no cervical adenopathy.  Neurological: He is alert.  Skin: Skin is warm and dry.  Psychiatric: He has a normal mood and affect. His speech is normal and behavior is normal.  Vitals reviewed.      Assessment & Plan:   1. Fever, unspecified fever cause Resolved. Well appearing . As I discussed with patient at length, very much sounds like he had a viral illness however pleased to see that he is  feeling much better. He continues to have fatigue and I explained that was normal. We jointly agreed for him to stay out of work the next couple days and continue to rest. He will let me know if he does not continue to improve.    Mr. Haddix does not currently have medications on file.   No orders of the defined types were placed in this encounter.   Return precautions given.   Risks, benefits, and alternatives of the medications and treatment plan prescribed today were discussed, and patient expressed understanding.   Education regarding symptom management and diagnosis given to patient on AVS.  Continue to follow with Viviana Simpler, MD for routine health maintenance.   Orlin Hilding and I agreed with plan.   Mable Paris, FNP

## 2016-06-17 NOTE — Progress Notes (Signed)
Pre visit review using our clinic review tool, if applicable. No additional management support is needed unless otherwise documented below in the visit note. 

## 2016-09-04 ENCOUNTER — Encounter: Payer: Self-pay | Admitting: Internal Medicine

## 2016-09-09 ENCOUNTER — Ambulatory Visit (INDEPENDENT_AMBULATORY_CARE_PROVIDER_SITE_OTHER): Payer: BLUE CROSS/BLUE SHIELD | Admitting: Internal Medicine

## 2016-09-09 ENCOUNTER — Encounter: Payer: Self-pay | Admitting: Internal Medicine

## 2016-09-09 VITALS — BP 126/80 | HR 87 | Temp 98.1°F | Ht 67.0 in | Wt 177.0 lb

## 2016-09-09 DIAGNOSIS — Z0000 Encounter for general adult medical examination without abnormal findings: Secondary | ICD-10-CM

## 2016-09-09 DIAGNOSIS — E785 Hyperlipidemia, unspecified: Secondary | ICD-10-CM

## 2016-09-09 DIAGNOSIS — Z23 Encounter for immunization: Secondary | ICD-10-CM | POA: Diagnosis not present

## 2016-09-09 DIAGNOSIS — R5383 Other fatigue: Secondary | ICD-10-CM | POA: Insufficient documentation

## 2016-09-09 DIAGNOSIS — K219 Gastro-esophageal reflux disease without esophagitis: Secondary | ICD-10-CM | POA: Diagnosis not present

## 2016-09-09 LAB — LIPID PANEL
CHOL/HDL RATIO: 5
Cholesterol: 223 mg/dL — ABNORMAL HIGH (ref 0–200)
HDL: 46.2 mg/dL (ref >39.00)
LDL CALC: 151 mg/dL — AB (ref 0–99)
NONHDL: 177.05
Triglycerides: 131 mg/dL (ref 0.0–149.0)
VLDL: 26.2 mg/dL (ref 0.0–40.0)

## 2016-09-09 LAB — CBC WITH DIFFERENTIAL/PLATELET
BASOS PCT: 0.9 % (ref 0.0–3.0)
Basophils Absolute: 0.1 10*3/uL (ref 0.0–0.1)
EOS PCT: 4.4 % (ref 0.0–5.0)
Eosinophils Absolute: 0.4 10*3/uL (ref 0.0–0.7)
HEMATOCRIT: 49 % (ref 39.0–52.0)
HEMOGLOBIN: 16.8 g/dL (ref 13.0–17.0)
LYMPHS PCT: 28.5 % (ref 12.0–46.0)
Lymphs Abs: 2.4 10*3/uL (ref 0.7–4.0)
MCHC: 34.3 g/dL (ref 30.0–36.0)
MCV: 87.2 fl (ref 78.0–100.0)
Monocytes Absolute: 0.9 10*3/uL (ref 0.1–1.0)
Monocytes Relative: 11.2 % (ref 3.0–12.0)
NEUTROS ABS: 4.7 10*3/uL (ref 1.4–7.7)
Neutrophils Relative %: 55 % (ref 43.0–77.0)
Platelets: 254 10*3/uL (ref 150.0–400.0)
RBC: 5.62 Mil/uL (ref 4.22–5.81)
RDW: 14.4 % (ref 11.5–15.5)
WBC: 8.5 10*3/uL (ref 4.0–10.5)

## 2016-09-09 LAB — COMPREHENSIVE METABOLIC PANEL
ALBUMIN: 4.4 g/dL (ref 3.5–5.2)
ALT: 42 U/L (ref 0–53)
AST: 31 U/L (ref 0–37)
Alkaline Phosphatase: 75 U/L (ref 39–117)
BUN: 15 mg/dL (ref 6–23)
CALCIUM: 9.6 mg/dL (ref 8.4–10.5)
CHLORIDE: 104 meq/L (ref 96–112)
CO2: 28 meq/L (ref 19–32)
Creatinine, Ser: 0.93 mg/dL (ref 0.40–1.50)
GFR: 89.91 mL/min (ref >60.00)
Glucose, Bld: 78 mg/dL (ref 70–99)
POTASSIUM: 3.8 meq/L (ref 3.5–5.1)
Sodium: 138 mEq/L (ref 135–145)
Total Bilirubin: 0.7 mg/dL (ref 0.2–1.2)
Total Protein: 7 g/dL (ref 6.0–8.3)

## 2016-09-09 LAB — T4, FREE: Free T4: 0.72 ng/dL (ref 0.60–1.60)

## 2016-09-09 NOTE — Addendum Note (Signed)
Addended by: Viviana Simpler I on: 09/09/2016 10:39 AM   Modules accepted: Level of Service

## 2016-09-09 NOTE — Assessment & Plan Note (Signed)
Generally healthy Will defer PSA till at least 55 Colon due next year Tdap today

## 2016-09-09 NOTE — Addendum Note (Signed)
Addended by: Pilar Grammes on: 09/09/2016 09:45 AM   Modules accepted: Orders

## 2016-09-09 NOTE — Assessment & Plan Note (Signed)
Non specific Exam reassuring  Will check BW

## 2016-09-09 NOTE — Progress Notes (Signed)
Subjective:    Patient ID: Matthew Walter, male    DOB: [birth year], 56 y.o.   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   No current facility-administered medications on file prior to visit.     No Known Allergies  Past Medical History:  Diagnosis Date  . GERD (gastroesophageal reflux disease)   . Hyperlipidemia     Past Surgical History:  Procedure Laterality Date  . ROTATOR CUFF REPAIR Bilateral 2010--R, 2012-- L   Dr Alphonzo Cruise and then Dr Theda Sers    Family History  Problem Relation Age of Onset  . Healthy Mother   . Cancer Maternal Grandmother   . Cancer Maternal Grandfather   . Hypertension Neg Hx   . Diabetes Neg Hx   . Colon cancer Neg Hx     Social History   Social History  . Marital status: Married    Spouse name: N/A  . Number of children: 4  . Years of education: N/A   Occupational History  . Drives for [employer]    Social History Main Topics  . Smoking status: Never Smoker  . Smokeless tobacco: Never Used  . Alcohol use Yes  . Drug use: No  . Sexual activity: Not on file   Other Topics Concern  . Not on file   Social History Narrative  . No narrative on file   Review of Systems  Constitutional: Positive for fatigue. Negative for unexpected weight change.       Wears seat belt  HENT: Negative for dental problem, hearing loss and tinnitus.        Keeps up with dentist  Eyes: Negative for visual disturbance.       No diplopia or unilateral vision loss  Respiratory: Positive for cough. Negative for chest tightness and  shortness of breath.   Cardiovascular: Negative for chest pain, palpitations and leg swelling.  Gastrointestinal: Negative for abdominal pain, blood in stool, constipation and nausea.  Endocrine: Negative for polydipsia and polyuria.  Genitourinary: Negative for urgency.       Stream is slower Less libido--no ED  Musculoskeletal: Positive for back pain. Negative for arthralgias and joint swelling.  Skin:       Some redness around nose--does see derm yearly Some scalp flaking also (discussed seborrhea)  Neurological: Positive for headaches. Negative for syncope.       Mild orthostatic dizziness --like getting up from crouch  Hematological: Negative for adenopathy. Does not bruise/bleed easily.  Psychiatric/Behavioral: Negative for dysphoric mood and sleep disturbance.       Some anxiety with financial issues lately--some better now       Objective:   Physical Exam  Constitutional: He is oriented to person, place, and time. He appears well-developed and well-nourished. No distress.  HENT:  Head: Normocephalic and atraumatic.  Right Ear: External ear normal.  Left Ear: External ear normal.  Mouth/Throat: Oropharynx is clear and moist. No oropharyngeal exudate.  Eyes: Conjunctivae are normal. Pupils are equal, round, and reactive to light.  Neck: Normal range of motion. Neck supple.  No thyromegaly present.  Cardiovascular: Normal rate, regular rhythm, normal heart sounds and intact distal pulses.  Exam reveals no gallop.   No murmur heard. Pulmonary/Chest: Effort normal and breath sounds normal. No respiratory distress. He has no wheezes. He has no rales.  Abdominal: Soft. There is no tenderness.  Musculoskeletal: He exhibits no edema or tenderness.  Lymphadenopathy:    He has no cervical adenopathy.    He has no axillary adenopathy.       Right: No inguinal adenopathy present.       Left: No inguinal adenopathy present.  Neurological: He is alert and oriented to person, place, and  time.  Skin: No rash noted. No erythema.  Multiple large nevi  Psychiatric: He has a normal mood and affect. His behavior is normal.          Assessment & Plan:

## 2016-09-09 NOTE — Patient Instructions (Signed)
Please try omeprazole 20mg  daily on empty stomach (like at bedtime) to see if that helps the throat symptoms.

## 2016-09-09 NOTE — Assessment & Plan Note (Signed)
Not excited about Rx Would consider if LDL over 190

## 2016-09-09 NOTE — Assessment & Plan Note (Signed)
His throat symptoms may be from this Discussed trying omeprazole to see if that helps symptoms

## 2016-10-14 ENCOUNTER — Ambulatory Visit (INDEPENDENT_AMBULATORY_CARE_PROVIDER_SITE_OTHER): Payer: BLUE CROSS/BLUE SHIELD | Admitting: Family Medicine

## 2016-10-14 ENCOUNTER — Encounter: Payer: Self-pay | Admitting: Family Medicine

## 2016-10-14 DIAGNOSIS — H6090 Unspecified otitis externa, unspecified ear: Secondary | ICD-10-CM | POA: Insufficient documentation

## 2016-10-14 DIAGNOSIS — H60501 Unspecified acute noninfective otitis externa, right ear: Secondary | ICD-10-CM

## 2016-10-14 MED ORDER — NEOMYCIN-POLYMYXIN-HC 3.5-10000-1 OT SOLN: 4.0000 [drp] | Freq: Three times a day (TID) | OTIC | 0 refills | Status: DC

## 2016-10-14 NOTE — Patient Instructions (Signed)

## 2016-10-14 NOTE — Progress Notes (Signed)
   Subjective:  Patient ID: Matthew Walter, male    DOB: 1960/06/05  Age: 56 y.o. MRN: [id number]  CC: Right ear pain  HPI:  57 year old male presents with complaints of right ear pain.  Right ear pain  Started on Monday.  Severe.  Has continued to worsen.  He has used over-the-counter ear drops as well as ibuprofen without improvement.  No reported swimming recently. He does use Q-tips.  No fevers or chills.  No known exacerbating factors.  No drainage from the ear.  No other associated upper respiratory symptoms. No other complaints at this time.  Social Hx   Social History   Social History  . Marital status: Married    Spouse name: N/A  . Number of children: 4  . Years of education: N/A   Occupational History  . Drives for [employer]    Social History Main Topics  . Smoking status: Never Smoker  . Smokeless tobacco: Never Used  . Alcohol use Yes  . Drug use: No  . Sexual activity: Not on file   Other Topics Concern  . Not on file   Social History Narrative  . No narrative on file    Review of Systems  Constitutional: Negative for fever.  HENT: Positive for ear pain.    Objective:  BP (!) 142/76 (BP Location: Right Arm, Patient Position: Sitting, Cuff Size: Normal)   Pulse 70   Temp 99 F (37.2 C) (Oral)   Wt 178 lb 4 oz (80.9 kg)   SpO2 98%   BMI 27.92 kg/m   BP/Weight 10/14/2016 11/07/537 09/19/7339  Systolic BP 937 902 409  Diastolic BP 76 80 88  Wt. (Lbs) 178.25 177 177.6  BMI 27.92 27.72 27.82    Physical Exam  Constitutional: He is oriented to person, place, and time. He appears well-developed. No distress.  HENT:  Head: Normocephalic and atraumatic.  Right ear - pain of the ear canal with use of otoscope. Mild erythema. White exudate noted.  Cardiovascular: Normal rate and regular rhythm.   Pulmonary/Chest: Effort normal. He has no wheezes. He has no rales.  Neurological: He is alert and oriented to person, place, and time.    Psychiatric: He has a normal mood and affect.  Vitals reviewed.   Lab Results  Component Value Date   WBC 8.5 09/09/2016   HGB 16.8 09/09/2016   HCT 49.0 09/09/2016   PLT 254.0 09/09/2016   GLUCOSE 78 09/09/2016   CHOL 223 (H) 09/09/2016   TRIG 131.0 09/09/2016   HDL 46.20 09/09/2016   LDLDIRECT 175.3 09/13/2012   LDLCALC 151 (H) 09/09/2016   ALT 42 09/09/2016   AST 31 09/09/2016   NA 138 09/09/2016   K 3.8 09/09/2016   CL 104 09/09/2016   CREATININE 0.93 09/09/2016   BUN 15 09/09/2016   CO2 28 09/09/2016   TSH 1.54 09/13/2012   PSA 1.75 09/13/2012    Assessment & Plan:   Problem List Items Addressed This Visit    Otitis externa    New acute problem. Treating with Cortisporin Otic.          Meds ordered this encounter  Medications  . neomycin-polymyxin-hydrocortisone (CORTISPORIN) OTIC solution    Sig: Place 4 drops into the right ear 3 (three) times daily.    Dispense:  10 mL    Refill:  0    Follow-up: PRN  [place]

## 2016-10-14 NOTE — Assessment & Plan Note (Signed)
New acute problem. Treating with Cortisporin Otic.

## 2017-11-30 ENCOUNTER — Encounter: Payer: Self-pay | Admitting: Internal Medicine

## 2017-11-30 ENCOUNTER — Ambulatory Visit (INDEPENDENT_AMBULATORY_CARE_PROVIDER_SITE_OTHER): Payer: Managed Care, Other (non HMO) | Admitting: Internal Medicine

## 2017-11-30 VITALS — BP 130/88 | HR 69 | Temp 97.8°F | Ht 67.0 in | Wt 179.0 lb

## 2017-11-30 DIAGNOSIS — J301 Allergic rhinitis due to pollen: Secondary | ICD-10-CM | POA: Diagnosis not present

## 2017-11-30 DIAGNOSIS — Z0000 Encounter for general adult medical examination without abnormal findings: Secondary | ICD-10-CM

## 2017-11-30 DIAGNOSIS — Z125 Encounter for screening for malignant neoplasm of prostate: Secondary | ICD-10-CM | POA: Diagnosis not present

## 2017-11-30 DIAGNOSIS — E785 Hyperlipidemia, unspecified: Secondary | ICD-10-CM

## 2017-11-30 LAB — COMPREHENSIVE METABOLIC PANEL
ALT: 38 U/L (ref 0–53)
AST: 28 U/L (ref 0–37)
Albumin: 4.7 g/dL (ref 3.5–5.2)
Alkaline Phosphatase: 87 U/L (ref 39–117)
BUN: 15 mg/dL (ref 6–23)
CHLORIDE: 102 meq/L (ref 96–112)
CO2: 26 mEq/L (ref 19–32)
Calcium: 9.8 mg/dL (ref 8.4–10.5)
Creatinine, Ser: 0.94 mg/dL (ref 0.40–1.50)
GFR: 88.41 mL/min (ref >60.00)
GLUCOSE: 79 mg/dL (ref 70–99)
POTASSIUM: 4.1 meq/L (ref 3.5–5.1)
SODIUM: 140 meq/L (ref 135–145)
Total Bilirubin: 0.6 mg/dL (ref 0.2–1.2)
Total Protein: 7.6 g/dL (ref 6.0–8.3)

## 2017-11-30 LAB — LIPID PANEL
CHOLESTEROL: 220 mg/dL — AB (ref 0–200)
HDL: 42.6 mg/dL (ref >39.00)
LDL CALC: 157 mg/dL — AB (ref 0–99)
NONHDL: 177.48
Total CHOL/HDL Ratio: 5
Triglycerides: 101 mg/dL (ref 0.0–149.0)
VLDL: 20.2 mg/dL (ref 0.0–40.0)

## 2017-11-30 LAB — CBC
HEMATOCRIT: 50.9 % (ref 39.0–52.0)
HEMOGLOBIN: 17.5 g/dL — AB (ref 13.0–17.0)
MCHC: 34.3 g/dL (ref 30.0–36.0)
MCV: 87.3 fl (ref 78.0–100.0)
Platelets: 259 10*3/uL (ref 150.0–400.0)
RBC: 5.83 Mil/uL — AB (ref 4.22–5.81)
RDW: 14 % (ref 11.5–15.5)
WBC: 8.5 10*3/uL (ref 4.0–10.5)

## 2017-11-30 LAB — PSA: PSA: 2.66 ng/mL (ref 0.10–4.00)

## 2017-11-30 NOTE — Progress Notes (Signed)
Subjective:    Patient ID: Matthew Walter, male    DOB: November 04, 1960, 57 y.o.   MRN: [id number]  HPI Here for physical  Does clear his throat a lot still Did try OTC acid med for a while---didn't really help  Has been using OTC meds for allergies May have helped the throat clearing  Still notes some fatigue--vague Works 4 days per week--- functionally 12 hour days Does note easier DOE Not doing any aerobic exercise---discussed  Current Outpatient Medications on File Prior to Visit  Medication Sig Dispense Refill  . cetirizine (ZYRTEC) 10 MG tablet Take 10 mg by mouth daily.    . fluticasone (FLONASE) 50 MCG/ACT nasal spray Place 2 sprays into both nostrils daily.     No current facility-administered medications on file prior to visit.     No Known Allergies  Past Medical History:  Diagnosis Date  . GERD (gastroesophageal reflux disease)   . Hyperlipidemia     Past Surgical History:  Procedure Laterality Date  . ROTATOR CUFF REPAIR Bilateral 2010--R, 2012-- L   Dr Alphonzo Cruise and then Dr Theda Sers    Family History  Problem Relation Age of Onset  . Healthy Mother   . Cancer Maternal Grandmother   . Cancer Maternal Grandfather   . Heart disease Father   . Hypertension Neg Hx   . Diabetes Neg Hx   . Colon cancer Neg Hx     Social History   Socioeconomic History  . Marital status: Married    Spouse name: Not on file  . Number of children: 4  . Years of education: Not on file  . Highest education level: Not on file  Occupational History  . Occupation: Heritage manager for [employer]  . Financial resource strain: Not on file  . Food insecurity:    Worry: Not on file    Inability: Not on file  . Transportation needs:    Medical: Not on file    Non-medical: Not on file  Tobacco Use  . Smoking status: Never Smoker  . Smokeless tobacco: Never Used  Substance and Sexual Activity  . Alcohol use: Yes  . Drug use: No  . Sexual activity: Not on file  Lifestyle    . Physical activity:    Days per week: Not on file    Minutes per session: Not on file  . Stress: Not on file  Relationships  . Social connections:    Talks on phone: Not on file    Gets together: Not on file    Attends religious service: Not on file    Active member of club or organization: Not on file    Attends meetings of clubs or organizations: Not on file    Relationship status: Not on file  . Intimate partner violence:    Fear of current or ex partner: Not on file    Emotionally abused: Not on file    Physically abused: Not on file    Forced sexual activity: Not on file  Other Topics Concern  . Not on file  Social History Narrative  . Not on file   Review of Systems  Constitutional: Negative for unexpected weight change.       Wears seat belt  HENT: Negative for hearing loss and tinnitus.        Keeps up with dentist  Eyes: Negative for visual disturbance.       No diplopia or unilateral vision loss  Respiratory: Positive for  shortness of breath. Negative for chest tightness.        Occ cough with drainage  Cardiovascular: Negative for chest pain, palpitations and leg swelling.  Gastrointestinal: Negative for abdominal pain, blood in stool and constipation.  Endocrine: Negative for polydipsia and polyuria.  Genitourinary: Negative for urgency.       Stream is slow to start No sexual problems  Musculoskeletal: Positive for arthralgias and back pain. Negative for joint swelling.       Chronic back and knee pain Keeps up with chiropractor Francisca December)  Skin:       Mild facial rash---- seeing derm for this  Allergic/Immunologic: Positive for environmental allergies. Negative for immunocompromised state.  Neurological: Negative for dizziness, syncope and light-headedness.       Occ sinus headache  Hematological: Negative for adenopathy. Does not bruise/bleed easily.  Psychiatric/Behavioral: Negative for dysphoric mood. The patient is not nervous/anxious.         Occasional sleep problems---wound up when gets home       Objective:   Physical Exam  Constitutional: He appears well-developed. No distress.  HENT:  Head: Normocephalic and atraumatic.  Right Ear: External ear normal.  Left Ear: External ear normal.  Mouth/Throat: Oropharynx is clear and moist. No oropharyngeal exudate.  Neck: No thyromegaly present.  Cardiovascular: Normal rate, regular rhythm, normal heart sounds and intact distal pulses. Exam reveals no gallop.  No murmur heard. Respiratory: Effort normal and breath sounds normal. No respiratory distress. He has no wheezes. He has no rales.  GI: Soft. There is no tenderness.  Musculoskeletal: He exhibits no edema or tenderness.  Lymphadenopathy:    He has no cervical adenopathy.  Neurological: He is alert.  Skin: No rash noted. No erythema.  Psychiatric: He has a normal mood and affect. His behavior is normal.           Assessment & Plan:

## 2017-11-30 NOTE — Assessment & Plan Note (Signed)
Healthy but needs to work on fitness Discussed PSA---will check Colon due in January Gets flu vaccine at work

## 2017-11-30 NOTE — Assessment & Plan Note (Signed)
Gets symptom relief with the OTC meds

## 2017-11-30 NOTE — Assessment & Plan Note (Signed)
Mild elevation  Discussed fitness efforts

## 2018-12-09 ENCOUNTER — Ambulatory Visit (INDEPENDENT_AMBULATORY_CARE_PROVIDER_SITE_OTHER): Payer: Managed Care, Other (non HMO) | Admitting: Internal Medicine

## 2018-12-09 ENCOUNTER — Encounter: Payer: Self-pay | Admitting: Internal Medicine

## 2018-12-09 ENCOUNTER — Other Ambulatory Visit: Payer: Self-pay

## 2018-12-09 ENCOUNTER — Telehealth: Payer: Self-pay

## 2018-12-09 VITALS — BP 134/90 | HR 75 | Temp 98.5°F | Ht 67.0 in | Wt 174.0 lb

## 2018-12-09 DIAGNOSIS — E785 Hyperlipidemia, unspecified: Secondary | ICD-10-CM | POA: Diagnosis not present

## 2018-12-09 DIAGNOSIS — J301 Allergic rhinitis due to pollen: Secondary | ICD-10-CM

## 2018-12-09 DIAGNOSIS — Z0000 Encounter for general adult medical examination without abnormal findings: Secondary | ICD-10-CM | POA: Diagnosis not present

## 2018-12-09 DIAGNOSIS — K219 Gastro-esophageal reflux disease without esophagitis: Secondary | ICD-10-CM

## 2018-12-09 LAB — CBC
HCT: 53.9 % — ABNORMAL HIGH (ref 39.0–52.0)
Hemoglobin: 17.8 g/dL — ABNORMAL HIGH (ref 13.0–17.0)
MCHC: 33.1 g/dL (ref 30.0–36.0)
MCV: 89.2 fl (ref 78.0–100.0)
Platelets: 252 10*3/uL (ref 150.0–400.0)
RBC: 6.04 Mil/uL — ABNORMAL HIGH (ref 4.22–5.81)
RDW: 14.6 % (ref 11.5–15.5)
WBC: 8.4 10*3/uL (ref 4.0–10.5)

## 2018-12-09 LAB — COMPREHENSIVE METABOLIC PANEL
ALT: 28 U/L (ref 0–53)
AST: 25 U/L (ref 0–37)
Albumin: 4.7 g/dL (ref 3.5–5.2)
Alkaline Phosphatase: 87 U/L (ref 39–117)
BUN: 17 mg/dL (ref 6–23)
CO2: 28 mEq/L (ref 19–32)
Calcium: 10.1 mg/dL (ref 8.4–10.5)
Chloride: 101 mEq/L (ref 96–112)
Creatinine, Ser: 0.94 mg/dL (ref 0.40–1.50)
GFR: 82.87 mL/min (ref >60.00)
Glucose, Bld: 81 mg/dL (ref 70–99)
Potassium: 4 mEq/L (ref 3.5–5.1)
Sodium: 138 mEq/L (ref 135–145)
Total Bilirubin: 0.6 mg/dL (ref 0.2–1.2)
Total Protein: 7.8 g/dL (ref 6.0–8.3)

## 2018-12-09 LAB — T4, FREE: Free T4: 0.88 ng/dL (ref 0.60–1.60)

## 2018-12-09 LAB — LIPID PANEL
Cholesterol: 194 mg/dL (ref 0–200)
HDL: 45.3 mg/dL (ref >39.00)
LDL Cholesterol: 128 mg/dL — ABNORMAL HIGH (ref 0–99)
NonHDL: 148.76
Total CHOL/HDL Ratio: 4
Triglycerides: 105 mg/dL (ref 0.0–149.0)
VLDL: 21 mg/dL (ref 0.0–40.0)

## 2018-12-09 MED ORDER — SILDENAFIL CITRATE 20 MG PO TABS: 60.0000 mg | ORAL_TABLET | Freq: Every day | ORAL | 11 refills | Status: AC | PRN

## 2018-12-09 NOTE — Assessment & Plan Note (Signed)
Mild No consideration for meds Will recheck

## 2018-12-09 NOTE — Assessment & Plan Note (Signed)
Discussed adding antihistamine in the morning as well

## 2018-12-09 NOTE — Assessment & Plan Note (Signed)
Healthy Discussed fitness Colon probably due in 2022--will check with Dr Henrene Pastor Defer PSA to next year He prefers no flu vaccine

## 2018-12-09 NOTE — Progress Notes (Signed)
Subjective:    Patient ID: Matthew Walter, male    DOB: 01-03-1961, 58 y.o.   MRN: [id number]  HPI Here for physical  Work has been full blast---he is exhausted by the end of his shift Still doing 4 days per week----up to 12 hour days though  Having sexual problems for about 8 months First had ED and then desire went down  Still has problems with allergies Has constant throat clearing---from post nasal drip Some dry cough at times Clearly worse if he forgets the zyrtec  Did have brief heartburn symptoms Took nexium and this improved (the burning) Didn't stop the throat clearing  Current Outpatient Medications on File Prior to Visit  Medication Sig Dispense Refill  . cetirizine (ZYRTEC) 10 MG tablet Take 10 mg by mouth daily.    . fluticasone (FLONASE) 50 MCG/ACT nasal spray Place 2 sprays into both nostrils daily.     No current facility-administered medications on file prior to visit.     No Known Allergies  Past Medical History:  Diagnosis Date  . GERD (gastroesophageal reflux disease)   . Hyperlipidemia     Past Surgical History:  Procedure Laterality Date  . ROTATOR CUFF REPAIR Bilateral 2010--R, 2012-- L   Dr Alphonzo Cruise and then Dr Theda Sers    Family History  Problem Relation Age of Onset  . Healthy Mother   . Cancer Maternal Grandmother   . Cancer Maternal Grandfather   . Heart disease Father   . Hypertension Neg Hx   . Diabetes Neg Hx   . Colon cancer Neg Hx     Social History   Socioeconomic History  . Marital status: Married    Spouse name: Not on file  . Number of children: 4  . Years of education: Not on file  . Highest education level: Not on file  Occupational History  . Occupation: Heritage manager for [employer]  . Financial resource strain: Not on file  . Food insecurity    Worry: Not on file    Inability: Not on file  . Transportation needs    Medical: Not on file    Non-medical: Not on file  Tobacco Use  . Smoking status: Never  Smoker  . Smokeless tobacco: Never Used  Substance and Sexual Activity  . Alcohol use: Yes  . Drug use: No  . Sexual activity: Not on file  Lifestyle  . Physical activity    Days per week: Not on file    Minutes per session: Not on file  . Stress: Not on file  Relationships  . Social Herbalist on phone: Not on file    Gets together: Not on file    Attends religious service: Not on file    Active member of club or organization: Not on file    Attends meetings of clubs or organizations: Not on file    Relationship status: Not on file  . Intimate partner violence    Fear of current or ex partner: Not on file    Emotionally abused: Not on file    Physically abused: Not on file    Forced sexual activity: Not on file  Other Topics Concern  . Not on file  Social History Narrative  . Not on file   Review of Systems  Constitutional: Negative for unexpected weight change.       Wears seat belt  HENT: Negative for dental problem, hearing loss and tinnitus.  Keeps up with dentist  Eyes: Negative for visual disturbance.       No diplopia or unilateral vision loss  Respiratory: Positive for cough. Negative for chest tightness and shortness of breath.   Cardiovascular: Negative for chest pain, palpitations and leg swelling.  Gastrointestinal: Negative for abdominal pain, blood in stool and constipation.  Endocrine: Negative for polydipsia and polyuria.  Genitourinary: Negative for urgency.       Stream is slower  Musculoskeletal: Positive for arthralgias.       Goes to Dr Francisca December weekly for adjustments  Skin:       Martin Majestic to dermatologist for redness/peeling around nose (seborrhea?)  Allergic/Immunologic: Positive for environmental allergies. Negative for immunocompromised state.  Neurological: Negative for dizziness, syncope and light-headedness.       Mild tremor in hands with fine movements---like trying to pull paper apart No FH of tremors Some sinus headaches   Hematological: Negative for adenopathy. Does not bruise/bleed easily.  Psychiatric/Behavioral: Negative for dysphoric mood and sleep disturbance.       Notes patience is shorter       Objective:   Physical Exam  Constitutional: He is oriented to person, place, and time. He appears well-developed. No distress.  HENT:  Head: Normocephalic and atraumatic.  Right Ear: External ear normal.  Left Ear: External ear normal.  Mouth/Throat: Oropharynx is clear and moist. No oropharyngeal exudate.  Eyes: Pupils are equal, round, and reactive to light. Conjunctivae are normal.  Neck: No thyromegaly present.  Cardiovascular: Normal rate, regular rhythm, normal heart sounds and intact distal pulses. Exam reveals no gallop.  No murmur heard. Respiratory: Effort normal and breath sounds normal. No respiratory distress. He has no wheezes. He has no rales.  GI: Soft. There is no abdominal tenderness.  Musculoskeletal:        General: No tenderness or edema.  Lymphadenopathy:    He has no cervical adenopathy.  Neurological: He is alert and oriented to person, place, and time.  Very fine tremor in hands No bradykinesia---gait normal, etc  Skin:  Multiple nevi--some fairly large (he does go regularly to derm)  Psychiatric: He has a normal mood and affect. His behavior is normal.           Assessment & Plan:

## 2018-12-09 NOTE — Telephone Encounter (Signed)
-----   Message from Irene Shipper, MD sent at 12/09/2018 11:31 AM EDT ----- Thanks Denice Paradise. Will do. Barbette Merino, This patient should have received a colonoscopy recall letter in January, but did not. Please send a recall letter now AND contact the patient, to let him know, and assist with scheduling a pre-visit. Thanks  JP ----- Message ----- From: Venia Carbon, MD Sent: 12/09/2018  10:16 AM EDT To: Irene Shipper, MD  Jenny Reichmann, He thought he was due for a colon in January and didn't get notified. I told him I think his recall is now 7 years. Can you clarify and let him know when he is due. Thanks, Denice Paradise

## 2018-12-09 NOTE — Assessment & Plan Note (Signed)
Takes meds prn.

## 2018-12-13 NOTE — Telephone Encounter (Signed)
Left message for pt to call back to schedule previsit and colon. Recall letter mailed out to pt also.

## 2018-12-14 ENCOUNTER — Telehealth: Payer: Self-pay | Admitting: Internal Medicine

## 2018-12-14 DIAGNOSIS — D751 Secondary polycythemia: Secondary | ICD-10-CM

## 2018-12-14 NOTE — Telephone Encounter (Signed)
Spoke to pt. He has an appt next week.

## 2018-12-14 NOTE — Telephone Encounter (Signed)
Patient called and stated he received his lab results on his my chart.in  The notes it stated that a nurse would be in contact with him about scheduling an appointment to see a specialist but he stated he has not heard from anyone and would like a call back   C/B # [phone]

## 2018-12-14 NOTE — Telephone Encounter (Signed)
Let him know referral sent and he should be called about this in the near future

## 2018-12-14 NOTE — Telephone Encounter (Signed)
Spoke to pt. He said if Dr Silvio Pate recommends it then he will do the referral to the hematologist.

## 2018-12-20 ENCOUNTER — Inpatient Hospital Stay: Payer: Managed Care, Other (non HMO) | Attending: Oncology | Admitting: Oncology

## 2018-12-20 ENCOUNTER — Other Ambulatory Visit: Payer: Self-pay

## 2018-12-20 ENCOUNTER — Encounter: Payer: Self-pay | Admitting: Oncology

## 2018-12-20 ENCOUNTER — Inpatient Hospital Stay: Payer: Managed Care, Other (non HMO)

## 2018-12-20 VITALS — BP 154/95 | HR 79 | Temp 97.0°F | Resp 18 | Ht 67.0 in | Wt 175.9 lb

## 2018-12-20 DIAGNOSIS — Z79899 Other long term (current) drug therapy: Secondary | ICD-10-CM | POA: Diagnosis not present

## 2018-12-20 DIAGNOSIS — Z8249 Family history of ischemic heart disease and other diseases of the circulatory system: Secondary | ICD-10-CM | POA: Diagnosis not present

## 2018-12-20 DIAGNOSIS — D751 Secondary polycythemia: Secondary | ICD-10-CM | POA: Diagnosis not present

## 2018-12-20 DIAGNOSIS — D7219 Other eosinophilia: Secondary | ICD-10-CM

## 2018-12-20 DIAGNOSIS — D72829 Elevated white blood cell count, unspecified: Secondary | ICD-10-CM

## 2018-12-20 DIAGNOSIS — E785 Hyperlipidemia, unspecified: Secondary | ICD-10-CM | POA: Diagnosis not present

## 2018-12-20 DIAGNOSIS — R5383 Other fatigue: Secondary | ICD-10-CM | POA: Insufficient documentation

## 2018-12-20 DIAGNOSIS — K219 Gastro-esophageal reflux disease without esophagitis: Secondary | ICD-10-CM | POA: Diagnosis not present

## 2018-12-20 LAB — IRON AND TIBC
Iron: 67 ug/dL (ref 45–182)
Saturation Ratios: 24 % (ref 17.9–39.5)
TIBC: 279 ug/dL (ref 250–450)
UIBC: 212 ug/dL

## 2018-12-20 LAB — CBC WITH DIFFERENTIAL/PLATELET
Abs Immature Granulocytes: 0.02 10*3/uL (ref 0.00–0.07)
Basophils Absolute: 0.1 10*3/uL (ref 0.0–0.1)
Basophils Relative: 1 %
Eosinophils Absolute: 0.6 10*3/uL — ABNORMAL HIGH (ref 0.0–0.5)
Eosinophils Relative: 7 %
HCT: 49.8 % (ref 39.0–52.0)
Hemoglobin: 17 g/dL (ref 13.0–17.0)
Immature Granulocytes: 0 %
Lymphocytes Relative: 29 %
Lymphs Abs: 2.6 10*3/uL (ref 0.7–4.0)
MCH: 29.2 pg (ref 26.0–34.0)
MCHC: 34.1 g/dL (ref 30.0–36.0)
MCV: 85.6 fL (ref 80.0–100.0)
Monocytes Absolute: 0.9 10*3/uL (ref 0.1–1.0)
Monocytes Relative: 10 %
Neutro Abs: 4.7 10*3/uL (ref 1.7–7.7)
Neutrophils Relative %: 53 %
Platelets: 244 10*3/uL (ref 150–400)
RBC: 5.82 MIL/uL — ABNORMAL HIGH (ref 4.22–5.81)
RDW: 13.6 % (ref 11.5–15.5)
WBC: 8.9 10*3/uL (ref 4.0–10.5)
nRBC: 0 % (ref 0.0–0.2)

## 2018-12-20 LAB — TECHNOLOGIST SMEAR REVIEW
Plt Morphology: ADEQUATE
RBC Morphology: NORMAL

## 2018-12-20 LAB — FERRITIN: Ferritin: 113 ng/mL (ref 24–336)

## 2018-12-20 NOTE — Progress Notes (Signed)
Hematology/Oncology Consult note [facility] [phone] Fax:([phone]) [phone]   Patient Care Team: Venia Carbon, MD as PCP - General  REFERRING PROVIDER: Venia Carbon, MD  CHIEF COMPLAINTS/REASON FOR VISIT:  Evaluation of polycytosis  HISTORY OF PRESENTING ILLNESS:  Matthew Walter is a 58 y.o. 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  12/09/2018 labs showed elevated hemoglobin at 17.8,  total white count 8.4, platelet counts 252,000. Chronic Onset, duration since 2010 No aggravating or alleviating factors.   Associated signs or symptoms: Denies weight loss, fever, chills, fatigue, night sweats.   Context:  Smoking history: Never smoker. Testosterone supplements: Denies any use of testosterone. History of blood clots: Denies Daytime somnolence: Sometimes Family history of polycythemia: Denies  Recently some allergy/sinus problems.  Mild fatigue, at baseline. Review of Systems  Constitutional: Positive for fatigue. Negative for appetite change, chills, fever and unexpected weight change.  HENT:   Negative for hearing loss and voice change.        Sinus problems  Eyes: Negative for eye problems and icterus.  Respiratory: Negative for chest tightness, cough and shortness of breath.   Cardiovascular: Negative for chest pain and leg swelling.  Gastrointestinal: Negative for abdominal distention and abdominal pain.  Endocrine: Negative for hot flashes.  Genitourinary: Negative for difficulty urinating, dysuria and frequency.   Musculoskeletal: Negative for arthralgias.  Skin: Negative for itching and rash.  Neurological: Negative for light-headedness and numbness.  Hematological: Negative for adenopathy. Does not bruise/bleed easily.  Psychiatric/Behavioral: Negative for confusion.    MEDICAL HISTORY:   Past Medical History:  Diagnosis Date  . GERD (gastroesophageal reflux disease)   . Hyperlipidemia     SURGICAL HISTORY: Past Surgical History:  Procedure Laterality Date  . ROTATOR CUFF REPAIR Bilateral 2010--R, 2012-- L   Dr Alphonzo Cruise and then Dr Theda Sers    SOCIAL HISTORY: Social History   Socioeconomic History  . Marital status: Married    Spouse name: Not on file  . Number of children: 4  . Years of education: Not on file  . Highest education level: Not on file  Occupational History  . Occupation: Heritage manager for [employer]  . Financial resource strain: Not on file  . Food insecurity    Worry: Not on file    Inability: Not on file  . Transportation needs    Medical: Not on file    Non-medical: Not on file  Tobacco Use  . Smoking status: Never Smoker  . Smokeless tobacco: Never Used  Substance and Sexual Activity  . Alcohol use: Yes  . Drug use: No  . Sexual activity: Not on file  Lifestyle  . Physical activity    Days per week: Not on file    Minutes per session: Not on file  . Stress: Not on file  Relationships  . Social Herbalist on phone: Not on file    Gets together: Not on file    Attends religious service: Not on file    Active member of club or organization: Not on file    Attends meetings of clubs or organizations: Not on file    Relationship status: Not on file  . Intimate partner violence    Fear of current or ex partner: Not on file    Emotionally abused: Not on file    Physically  abused: Not on file    Forced sexual activity: Not on file  Other Topics Concern  . Not on file  Social History Narrative  . Not on file    FAMILY HISTORY: Family History  Problem Relation Age of Onset  . Healthy Mother   . Cancer Maternal Grandmother   . Cancer Maternal Grandfather   . Heart disease Father   . Cancer Maternal Uncle   . Hypertension Neg Hx   . Diabetes Neg Hx   . Colon cancer Neg Hx     ALLERGIES:  has No Known  Allergies.  MEDICATIONS:  Current Outpatient Medications  Medication Sig Dispense Refill  . cetirizine (ZYRTEC) 10 MG tablet Take 10 mg by mouth daily.    . fluticasone (FLONASE) 50 MCG/ACT nasal spray Place 2 sprays into both nostrils daily.    . sildenafil (REVATIO) 20 MG tablet Take 3-5 tablets (60-100 mg total) by mouth daily as needed. 50 tablet 11   No current facility-administered medications for this visit.      PHYSICAL EXAMINATION: ECOG PERFORMANCE STATUS: 0 - Asymptomatic Vitals:   12/20/18 1059  BP: (!) 154/95  Pulse: 79  Resp: 18  Temp: (!) 97 F (36.1 C)  SpO2: 97%   Filed Weights   12/20/18 1059  Weight: 175 lb 14.4 oz (79.8 kg)    Physical Exam Constitutional:      General: He is not in acute distress. HENT:     Head: Normocephalic and atraumatic.  Eyes:     General: No scleral icterus.    Pupils: Pupils are equal, round, and reactive to light.  Neck:     Musculoskeletal: Normal range of motion and neck supple.  Cardiovascular:     Rate and Rhythm: Normal rate and regular rhythm.     Heart sounds: Normal heart sounds.  Pulmonary:     Effort: Pulmonary effort is normal. No respiratory distress.     Breath sounds: No wheezing.  Abdominal:     General: Bowel sounds are normal. There is no distension.     Palpations: Abdomen is soft. There is no mass.     Tenderness: There is no abdominal tenderness.  Musculoskeletal: Normal range of motion.        General: No deformity.  Skin:    General: Skin is warm and dry.     Findings: No erythema or rash.  Neurological:     Mental Status: He is alert and oriented to person, place, and time.     Cranial Nerves: No cranial nerve deficit.     Coordination: Coordination normal.  Psychiatric:        Behavior: Behavior normal.        Thought Content: Thought content normal.     RADIOGRAPHIC STUDIES: I have personally reviewed the radiological images as listed and agreed with the findings in the report. No  results found.   LABORATORY DATA:  I have reviewed the data as listed Lab Results  Component Value Date   WBC 8.9 12/20/2018   HGB 17.0 12/20/2018   HCT 49.8 12/20/2018   MCV 85.6 12/20/2018   PLT 244 12/20/2018   Recent Labs    12/09/18 1026  NA 138  K 4.0  CL 101  CO2 28  GLUCOSE 81  BUN 17  CREATININE 0.94  CALCIUM 10.1  PROT 7.8  ALBUMIN 4.7  AST 25  ALT 28  ALKPHOS 87  BILITOT 0.6   Iron/TIBC/Ferritin/ %Sat    Component Value Date/Time  IRON 67 12/20/2018 1204   TIBC 279 12/20/2018 1204   FERRITIN 113 12/20/2018 1204   IRONPCTSAT 24 12/20/2018 1204        ASSESSMENT & PLAN:  1. Erythrocytosis   2. Eosinophilic leukocytosis, unspecified type    Labs are reviewed and discussed with patient. Polycythemia (erythrocytosis) is an abnormal elevation of hemoglobin (Hgb) and/or hematocrit (Hct) in peripheral blood, and this can be caused by primary etiology, ie bone marrow mutation, or secondary etiology, ie hypoxia, smoking, androgen supplements, etc.  I will obtain erythropoietin, carbo monoxide level, rule out primary etiology, JAK2 with reflex to other mutations, check iron panels. CBC was reviewed.  Hemoglobin 17.  No urgent phlebotomy needed.  Eosinophilia.  Probably reactive.  Will monitor. Await other blood work Orders Placed This Encounter  Procedures  . CBC with Differential/Platelet    Standing Status:   Future    Number of Occurrences:   1    Standing Expiration Date:   12/20/2019  . Erythropoietin    Standing Status:   Future    Number of Occurrences:   1    Standing Expiration Date:   12/20/2019  . JAK2 V617F, w Reflex to CALR/E12/MPL    Standing Status:   Future    Number of Occurrences:   1    Standing Expiration Date:   12/20/2019  . Carbon monoxide, blood (performed at ref lab)    Standing Status:   Future    Number of Occurrences:   1    Standing Expiration Date:   12/20/2019  . Technologist smear review    Standing Status:   Future     Number of Occurrences:   1    Standing Expiration Date:   12/20/2019  . Ferritin    Standing Status:   Future    Number of Occurrences:   1    Standing Expiration Date:   12/20/2019  . Iron and TIBC    Standing Status:   Future    Number of Occurrences:   1    Standing Expiration Date:   12/20/2019    We spent sufficient time to discuss many aspect of care, questions were answered to patient's satisfaction. The patient knows to call the clinic with any problems questions or concerns.  Cc Venia Carbon, MD  Return of visit: 2 weeks Thank you for this kind referral and the opportunity to participate in the care of this patient. A copy of today's note is routed to referring provider  Total face to face encounter time for this patient visit was 45 min. >50% of the time was  spent in counseling and coordination of care.    Earlie Server, MD, PhD 12/20/2018

## 2018-12-20 NOTE — Progress Notes (Signed)
Patient here to establish care. No concerns voiced.

## 2018-12-21 LAB — ERYTHROPOIETIN: Erythropoietin: 5.4 m[IU]/mL (ref 2.6–18.5)

## 2018-12-21 LAB — CARBON MONOXIDE, BLOOD (PERFORMED AT REF LAB): Carbon Monoxide, Blood: 2.6 % (ref 0.0–3.6)

## 2018-12-23 NOTE — Telephone Encounter (Signed)
Noted. Thanks.

## 2018-12-23 NOTE — Telephone Encounter (Signed)
Finally able to reach pt today. States he cannot schedule colon until the beginning of the year. Reports he works for [employer] and has used all his time off. Pt states he will call back in November to schedule colon. Dr. Henrene Pastor notified.

## 2018-12-26 ENCOUNTER — Encounter: Payer: Self-pay | Admitting: Oncology

## 2018-12-30 LAB — CALR + JAK2 E12-15 + MPL (REFLEXED)

## 2018-12-30 LAB — JAK2 V617F, W REFLEX TO CALR/E12/MPL

## 2019-01-12 NOTE — Progress Notes (Signed)
Patient is coming in for follow up, he is doing well no complaints but has been getting lab results but would like a better understanding of the results

## 2019-01-13 ENCOUNTER — Inpatient Hospital Stay (HOSPITAL_BASED_OUTPATIENT_CLINIC_OR_DEPARTMENT_OTHER): Payer: Managed Care, Other (non HMO) | Admitting: Oncology

## 2019-01-13 ENCOUNTER — Encounter: Payer: Self-pay | Admitting: Oncology

## 2019-01-13 ENCOUNTER — Other Ambulatory Visit: Payer: Self-pay

## 2019-01-13 VITALS — BP 149/90 | HR 62 | Temp 96.6°F | Resp 16 | Wt 172.7 lb

## 2019-01-13 DIAGNOSIS — D751 Secondary polycythemia: Secondary | ICD-10-CM | POA: Diagnosis not present

## 2019-01-13 NOTE — Progress Notes (Signed)
Patient does not offer any problems today.  

## 2019-01-15 NOTE — Progress Notes (Signed)
Hematology/Oncology Consult note [facility] Telephone:([phone] Fax:(336) [phone]   Patient Care Team: Venia Carbon, MD as PCP - General  REFERRING PROVIDER: Venia Carbon, MD  CHIEF COMPLAINTS/REASON FOR VISIT:  Evaluation of polycytosis  HISTORY OF PRESENTING ILLNESS:  Matthew Walter is a 58 y.o. 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  12/09/2018 labs showed elevated hemoglobin at 17.8,  total white count 8.4, platelet counts 252,000. Chronic Onset, duration since 2010 No aggravating or alleviating factors.   Associated signs or symptoms: Denies weight loss, fever, chills, fatigue, night sweats.   Context:  Smoking history: Never smoker. Testosterone supplements: Denies any use of testosterone. History of blood clots: Denies Daytime somnolence: Sometimes Family history of polycythemia: Denies  Recently some allergy/sinus problems.  Mild fatigue, at baseline.   INTERVAL HISTORY Matthew Walter is a 57 y.o. male who has above history reviewed by me today presents for follow up visit for management of polycytosis Problems and complaints are listed below: Patient had blood work-up done and present to discuss results. Continue to have chronic sinus problems. Fatigue unchanged.  No new complaints.  Review of Systems  Constitutional: Positive for fatigue. Negative for appetite change, chills, fever and unexpected weight change.  HENT:   Negative for hearing loss and voice change.        Sinus problems  Eyes: Negative for eye problems and icterus.  Respiratory: Negative for chest tightness, cough and shortness of breath.   Cardiovascular: Negative for chest pain and leg swelling.  Gastrointestinal: Negative for abdominal distention and abdominal pain.  Endocrine: Negative for hot flashes.  Genitourinary:  Negative for difficulty urinating, dysuria and frequency.   Musculoskeletal: Negative for arthralgias.  Skin: Negative for itching and rash.  Neurological: Negative for light-headedness and numbness.  Hematological: Negative for adenopathy. Does not bruise/bleed easily.  Psychiatric/Behavioral: Negative for confusion.    MEDICAL HISTORY:  Past Medical History:  Diagnosis Date  . GERD (gastroesophageal reflux disease)   . Hyperlipidemia     SURGICAL HISTORY: Past Surgical History:  Procedure Laterality Date  . ROTATOR CUFF REPAIR Bilateral 2010--R, 2012-- L   Dr Alphonzo Cruise and then Dr Theda Sers    SOCIAL HISTORY: Social History   Socioeconomic History  . Marital status: Married    Spouse name: Not on file  . Number of children: 4  . Years of education: Not on file  . Highest education level: Not on file  Occupational History  . Occupation: Heritage manager for [employer]  . Financial resource strain: Not on file  . Food insecurity    Worry: Not on file    Inability: Not on file  . Transportation needs    Medical: Not on file    Non-medical: Not on file  Tobacco Use  . Smoking status: Never Smoker  . Smokeless tobacco: Never Used  Substance and Sexual Activity  . Alcohol use: Yes  . Drug use: No  . Sexual activity: Not on file  Lifestyle  . Physical activity    Days per week: Not on file    Minutes per session: Not on file  . Stress: Not on file  Relationships  . Social Herbalist on phone: Not on file    Gets together: Not on file    Attends religious service: Not on file  Active member of club or organization: Not on file    Attends meetings of clubs or organizations: Not on file    Relationship status: Not on file  . Intimate partner violence    Fear of current or ex partner: Not on file    Emotionally abused: Not on file    Physically abused: Not on file    Forced sexual activity: Not on file  Other Topics Concern  . Not on file  Social  History Narrative  . Not on file    FAMILY HISTORY: Family History  Problem Relation Age of Onset  . Healthy Mother   . Cancer Maternal Grandmother   . Cancer Maternal Grandfather   . Heart disease Father   . Cancer Maternal Uncle   . Hypertension Neg Hx   . Diabetes Neg Hx   . Colon cancer Neg Hx     ALLERGIES:  has No Known Allergies.  MEDICATIONS:  Current Outpatient Medications  Medication Sig Dispense Refill  . cetirizine (ZYRTEC) 10 MG tablet Take 10 mg by mouth daily.    . fluticasone (FLONASE) 50 MCG/ACT nasal spray Place 2 sprays into both nostrils daily.    . sildenafil (REVATIO) 20 MG tablet Take 3-5 tablets (60-100 mg total) by mouth daily as needed. 50 tablet 11   No current facility-administered medications for this visit.      PHYSICAL EXAMINATION: ECOG PERFORMANCE STATUS: 0 - Asymptomatic Vitals:   01/13/19 0956  BP: (!) 149/90  Pulse: 62  Resp: 16  Temp: (!) 96.6 F (35.9 C)   Filed Weights   01/13/19 0956  Weight: 172 lb 11.2 oz (78.3 kg)    Physical Exam Constitutional:      General: He is not in acute distress. HENT:     Head: Normocephalic and atraumatic.  Eyes:     General: No scleral icterus.    Pupils: Pupils are equal, round, and reactive to light.  Neck:     Musculoskeletal: Normal range of motion and neck supple.  Cardiovascular:     Rate and Rhythm: Normal rate and regular rhythm.     Heart sounds: Normal heart sounds.  Pulmonary:     Effort: Pulmonary effort is normal. No respiratory distress.     Breath sounds: No wheezing.  Abdominal:     General: Bowel sounds are normal. There is no distension.     Palpations: Abdomen is soft. There is no mass.     Tenderness: There is no abdominal tenderness.  Musculoskeletal: Normal range of motion.        General: No deformity.  Skin:    General: Skin is warm and dry.     Findings: No erythema or rash.  Neurological:     Mental Status: He is alert and oriented to person, place,  and time.     Cranial Nerves: No cranial nerve deficit.     Coordination: Coordination normal.  Psychiatric:        Behavior: Behavior normal.        Thought Content: Thought content normal.     RADIOGRAPHIC STUDIES: I have personally reviewed the radiological images as listed and agreed with the findings in the report. No results found.   LABORATORY DATA:  I have reviewed the data as listed Lab Results  Component Value Date   WBC 8.9 12/20/2018   HGB 17.0 12/20/2018   HCT 49.8 12/20/2018   MCV 85.6 12/20/2018   PLT 244 12/20/2018   Recent Labs  12/09/18 1026  NA 138  K 4.0  CL 101  CO2 28  GLUCOSE 81  BUN 17  CREATININE 0.94  CALCIUM 10.1  PROT 7.8  ALBUMIN 4.7  AST 25  ALT 28  ALKPHOS 87  BILITOT 0.6   Iron/TIBC/Ferritin/ %Sat    Component Value Date/Time   IRON 67 12/20/2018 1204   TIBC 279 12/20/2018 1204   FERRITIN 113 12/20/2018 1204   IRONPCTSAT 24 12/20/2018 1204        ASSESSMENT & PLAN:  1. Erythrocytosis    #Labs are reviewed and discussed with patient. JAK 2 V617F mutation negative, reflex to CARL, MPL, exon 12-15 all negative. Normal erythropoietin level, carbon monoxide level. Less likely primary erythrocytosis. I discussed with patient that I recommend patient to have sleep study rule out secondary erythrocytosis. Communicated with primary care provider Dr. Silvio Pate. Will refer to pulmonology for further evaluation. If secondary erythrocytosis work-up is negative, will proceed with bone marrow biopsy. Currently hemoglobin 17 with borderline high hematocrit.  No need for urgent phlebotomy. Follow-up in 3 months for reevaluation.  #Increased basophils.  Etiology unknown.  Will repeat at next visit.  If persistent will check BCR ABL   Orders Placed This Encounter  Procedures  . CBC with Differential/Platelet    Standing Status:   Future    Standing Expiration Date:   01/13/2020  . Ambulatory referral to Pulmonology    Referral  Priority:   Routine    Referral Type:   Consultation    Referral Reason:   Specialty Services Required    Referred to Provider:   Flora Lipps, MD    Requested Specialty:   Pulmonary Disease    Number of Visits Requested:   1    We spent sufficient time to discuss many aspect of care, questions were answered to patient's satisfaction. The patient knows to call the clinic with any problems questions or concerns.  Cc Venia Carbon, MD  Return of visit: 3 months.   Earlie Server, MD, PhD 01/15/2019

## 2019-02-01 ENCOUNTER — Encounter: Payer: Self-pay | Admitting: Pulmonary Disease

## 2019-02-01 ENCOUNTER — Other Ambulatory Visit: Payer: Self-pay

## 2019-02-01 ENCOUNTER — Ambulatory Visit (INDEPENDENT_AMBULATORY_CARE_PROVIDER_SITE_OTHER): Payer: Managed Care, Other (non HMO) | Admitting: Pulmonary Disease

## 2019-02-01 VITALS — BP 144/86 | HR 66 | Ht 67.0 in | Wt 171.0 lb

## 2019-02-01 DIAGNOSIS — R0683 Snoring: Secondary | ICD-10-CM | POA: Diagnosis not present

## 2019-02-01 DIAGNOSIS — D751 Secondary polycythemia: Secondary | ICD-10-CM

## 2019-02-01 NOTE — Progress Notes (Addendum)
[facility]  Chief Complaint  Patient presents with  . Sleep Consult    Constitutional:  BP (!) 144/86 (BP Location: Right Arm, Cuff Size: Normal)   Pulse 66   Ht 5\' 7"  (1.702 m)   Wt 171 lb (77.6 kg)   SpO2 99%   BMI 26.78 kg/m   Past Medical History:  HLD, GERD, Erythrocytosis, ED  Brief Summary:  Matthew Walter is a 58 y.o. male never smoker with snoring and erythrocytosis.  He has recently been seen by PCP and hematology.  He has persistently elevated hemoglobin.  Concern he could have secondary cause for this and advised to have further pulmonary/sleep assessment.  His wife has told him that he snores.  He has trouble sleeping on his back.  Doesn't remember dreaming much.  Occasionally is restless at night.  He goes to sleep at 9 pm.  He falls asleep in 10 minutes.  He sleeps through the night  He gets out of bed at 515 am.  He feels okay in the morning.  He denies morning headache.  He does not use anything to help him fall sleep or stay awake.  He denies sleep walking, sleep talking, bruxism, or nightmares.  There is no history of restless legs.  He denies sleep hallucinations, sleep paralysis, or cataplexy.  The Epworth score is 6 out of 24.  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  He is fairly active with his work.  Doesn't have trouble feeling short of breath with activity.  No history of smoking.  No history of asthma, pneumonia, or thromboembolic disease.  Denies cough, wheeze, sputum, chest pain, or chest congestion.  No history of smoking cigarettes.  No family history of lung disease.  He has notice more trouble with allergies over the past couple years.  Has been using zyrtec and flonase.   Physical Exam:   Appearance - well kempt   ENMT - clear nasal mucosa, midline nasal  septum, no oral exudates, no LAN, trachea midline, MP 4, enlarged tongue, high arched palate  Respiratory - normal chest wall, normal  respiratory effort, no accessory muscle use, no wheeze/rales  CV - s1s2 regular rate and rhythm, no murmurs, no peripheral edema, radial pulses symmetric  GI - soft, non tender, no masses  Lymph - no adenopathy noted in neck and axillary areas  MSK - normal gait  Ext - no cyanosis, clubbing, or joint inflammation noted  Skin - no rashes, lesions, or ulcers  Neuro - normal strength, oriented x 3  Psych - normal mood and affect  Discussion:  He has snoring, sleep disruption, and intermittent daytime sleepiness.  He has persistently elevated hemoglobin.  He has upper airway findings suggestive of risk for sleep apnea.  It is possible he could have obstructive sleep apnea with nocturnal hypoxia causing erythrocytosis.  Assessment/Plan:   Snoring with excessive daytime sleepiness. - will need to arrange for a home sleep study  Allergic rhinitis. - explained how this could contribute to worsening of sleep apnea if he has it, but wouldn't usually be the sole cause of sleep apnea - continue flonase and zyrtec  Erythrocytosis. - f/u with Dr. Tasia Catchings with hematology - if evaluation for sleep apnea is unrevealing, then he would need further pulmonary assessment with chest imaging and pulmonary function testing  Cardiovascular risk. - had an extensive discussion regarding the adverse health consequences related to untreated sleep disordered breathing - specifically discussed the risks  for hypertension, coronary artery disease, cardiac dysrhythmias, cerebrovascular disease, and diabetes - lifestyle modification discussed  Safe driving practices. - discussed how sleep disruption can increase risk of accidents, particularly when driving - safe driving practices were discussed  Therapies for obstructive sleep apnea. - if the sleep study shows significant sleep apnea, then various therapies for treatment were reviewed: CPAP, oral appliance, and surgical interventions   Patient Instructions   Will arrange for home sleep study Will call to arrange for follow up after sleep study reviewed     Chesley Mires, MD [place] Pager: [phone] 02/01/2019, 11:13 AM  Flow Sheet    Sleep tests:    Review of Systems:  Reviewed and negative  Medications:   Allergies as of 02/01/2019   No Known Allergies     Medication List       Accurate as of February 01, 2019 11:13 AM. If you have any questions, ask your nurse or doctor.        cetirizine 10 MG tablet Commonly known as: ZYRTEC Take 10 mg by mouth daily.   fluticasone 50 MCG/ACT nasal spray Commonly known as: FLONASE Place 2 sprays into both nostrils daily.   sildenafil 20 MG tablet Commonly known as: REVATIO Take 3-5 tablets (60-100 mg total) by mouth daily as needed.       Past Surgical History:  He  has a past surgical history that includes Rotator cuff repair (Bilateral, 2010--R, 2012-- L).  Family History:  His family history includes Cancer in his maternal grandfather, maternal grandmother, and maternal uncle; Healthy in his mother; Heart disease in his father.  Social History:  He  reports that he has never smoked. He has never used smokeless tobacco. He reports current alcohol use. He reports that he does not use drugs.

## 2019-02-01 NOTE — Patient Instructions (Signed)
Will arrange for home sleep study Will call to arrange for follow up after sleep study reviewed  

## 2019-02-08 ENCOUNTER — Encounter: Payer: Self-pay | Admitting: Internal Medicine

## 2019-02-14 NOTE — Telephone Encounter (Signed)
We are not scheduling home sleep studies at all right now due to increase in covid cases until Matthew Walter gives [place] the ok.  Please let pt know thru MyChart that we will call him as soon as we are told that we can start scheduling them again.

## 2019-03-13 ENCOUNTER — Encounter: Payer: Self-pay | Admitting: Oncology

## 2019-04-10 ENCOUNTER — Telehealth: Payer: Self-pay | Admitting: *Deleted

## 2019-04-10 NOTE — Telephone Encounter (Signed)
Patient called reporting that he has not had the Sleep study because they are not scheduling them right now. He is asking if he needs to keep his appointment on Wednesday or not. Please advise

## 2019-04-11 ENCOUNTER — Other Ambulatory Visit: Payer: Managed Care, Other (non HMO)

## 2019-04-11 ENCOUNTER — Ambulatory Visit: Payer: Managed Care, Other (non HMO) | Admitting: Oncology

## 2019-04-11 ENCOUNTER — Other Ambulatory Visit: Payer: Self-pay

## 2019-04-11 NOTE — Telephone Encounter (Signed)
I recommend him to keep his appt as his hemoglobin needs to be followed, if too high will need phlebotomy.

## 2019-04-11 NOTE — Progress Notes (Signed)
Patient pre screened for office appointment, no questions or concerns today. Patient reminded of upcoming appointment time and date. 

## 2019-04-11 NOTE — Telephone Encounter (Signed)
Call returned to patient and he states he will be at [facility] appointment

## 2019-04-12 ENCOUNTER — Inpatient Hospital Stay: Payer: Managed Care, Other (non HMO)

## 2019-04-12 ENCOUNTER — Ambulatory Visit
Admission: RE | Admit: 2019-04-12 | Discharge: 2019-04-12 | Disposition: A | Discharge status: Home / Self Care | Payer: Managed Care, Other (non HMO) | Source: Ambulatory Visit | Attending: Oncology | Admitting: Oncology

## 2019-04-12 ENCOUNTER — Inpatient Hospital Stay (HOSPITAL_BASED_OUTPATIENT_CLINIC_OR_DEPARTMENT_OTHER): Payer: Managed Care, Other (non HMO) | Admitting: Oncology

## 2019-04-12 ENCOUNTER — Encounter: Payer: Self-pay | Admitting: Oncology

## 2019-04-12 ENCOUNTER — Other Ambulatory Visit: Payer: Self-pay

## 2019-04-12 VITALS — BP 130/81 | HR 73 | Temp 97.2°F | Resp 18 | Wt 176.9 lb

## 2019-04-12 DIAGNOSIS — D751 Secondary polycythemia: Secondary | ICD-10-CM | POA: Insufficient documentation

## 2019-04-12 DIAGNOSIS — Z79899 Other long term (current) drug therapy: Secondary | ICD-10-CM | POA: Insufficient documentation

## 2019-04-12 DIAGNOSIS — Z833 Family history of diabetes mellitus: Secondary | ICD-10-CM | POA: Insufficient documentation

## 2019-04-12 DIAGNOSIS — R059 Cough, unspecified: Secondary | ICD-10-CM

## 2019-04-12 DIAGNOSIS — R05 Cough: Secondary | ICD-10-CM

## 2019-04-12 DIAGNOSIS — R062 Wheezing: Secondary | ICD-10-CM | POA: Insufficient documentation

## 2019-04-12 DIAGNOSIS — Z8249 Family history of ischemic heart disease and other diseases of the circulatory system: Secondary | ICD-10-CM | POA: Insufficient documentation

## 2019-04-12 DIAGNOSIS — E785 Hyperlipidemia, unspecified: Secondary | ICD-10-CM | POA: Insufficient documentation

## 2019-04-12 DIAGNOSIS — Z809 Family history of malignant neoplasm, unspecified: Secondary | ICD-10-CM | POA: Insufficient documentation

## 2019-04-12 DIAGNOSIS — D7219 Other eosinophilia: Secondary | ICD-10-CM

## 2019-04-12 DIAGNOSIS — J45909 Unspecified asthma, uncomplicated: Secondary | ICD-10-CM | POA: Insufficient documentation

## 2019-04-12 LAB — CBC WITH DIFFERENTIAL/PLATELET
Abs Immature Granulocytes: 0.03 10*3/uL (ref 0.00–0.07)
Basophils Absolute: 0.1 10*3/uL (ref 0.0–0.1)
Basophils Relative: 1 %
Eosinophils Absolute: 0.7 10*3/uL — ABNORMAL HIGH (ref 0.0–0.5)
Eosinophils Relative: 7 %
HCT: 49.3 % (ref 39.0–52.0)
Hemoglobin: 16.2 g/dL (ref 13.0–17.0)
Immature Granulocytes: 0 %
Lymphocytes Relative: 31 %
Lymphs Abs: 2.9 10*3/uL (ref 0.7–4.0)
MCH: 28.9 pg (ref 26.0–34.0)
MCHC: 32.9 g/dL (ref 30.0–36.0)
MCV: 88 fL (ref 80.0–100.0)
Monocytes Absolute: 0.8 10*3/uL (ref 0.1–1.0)
Monocytes Relative: 9 %
Neutro Abs: 5 10*3/uL (ref 1.7–7.7)
Neutrophils Relative %: 52 %
Platelets: 251 10*3/uL (ref 150–400)
RBC: 5.6 MIL/uL (ref 4.22–5.81)
RDW: 13.2 % (ref 11.5–15.5)
WBC: 9.5 10*3/uL (ref 4.0–10.5)
nRBC: 0 % (ref 0.0–0.2)

## 2019-04-12 MED ORDER — ALBUTEROL SULFATE HFA 108 (90 BASE) MCG/ACT IN AERS: 2.0000 | INHALATION_SPRAY | Freq: Four times a day (QID) | RESPIRATORY_TRACT | 0 refills | Status: DC | PRN

## 2019-04-12 NOTE — Telephone Encounter (Signed)
Seems ok to have an office visit. Is this new asthma? Do we treat him for this? If not it would need to be a consult if we only see him for sleep apnea.

## 2019-04-12 NOTE — Telephone Encounter (Signed)
I read his note. This was a sleep consult for snoring. No respiratory symptoms were assessed or diagnosed. Needs pulmonary consult or he can see PCP for acute wheezing.

## 2019-04-12 NOTE — Telephone Encounter (Signed)
I think it would be new- but I was not sure if would need new consult since Dr Halford Chessman mentioned sleep/pulmonary in his consult note   Brief Summary:  Matthew Walter is a 59 y.o. male never smoker with snoring and erythrocytosis.  He has recently been seen by PCP and hematology.  He has persistently elevated hemoglobin.  Concern he could have secondary cause for this and advised to have further pulmonary/sleep assessment.

## 2019-04-12 NOTE — Telephone Encounter (Signed)
Beth- see email chain and advise if you feel this is appropriate to schedule in office, thanks  Akkerman, Val Riles to Chesley Mires, MD   JF  04/12/19 4:57 PM no I have not been in contact with anybody from covid nor am I having any symptoms I am scheduled to come in and pick up my at-home sleep test at 2:00 on Thursday so I guess I could see somebody on Thursday or when I return the unit back on Friday.would the nurse practitioner be able to take a look at the x-rays that I had done today thank you Me to Enio, Vallee     04/12/19 4:45 PM Hi Barnabas Lister,  I looked at the schedule and unfortunately Dr Halford Chessman is not in the office next week. We could possibly bring you in to see one of your nurse practiotioners but you will need to be screened first for covid. Are you having any shortness of breath, body aches, cough, fever, loss of taste or smell? Have you been around anyone who has tested positive for covid or you yourself tested positive for covid?  Last read by Orlin Hilding at 4:51 PM on 04/12/2019. Koffler, Val Riles to Chesley Mires, MD   JF  04/12/19 3:53 PM Good afternoon I had an appointment with my hematologist Dr Tasia Catchings  she listened to my lungs and said that I was wheezing that she would forward the notes to Dr Halford Chessman I am scheduled for an at-home sleep text next Thursday wanted to know if I should also schedule an appointment with Dr Halford Chessman. I also had a chest X-ray done today.Dr Tasia Catchings thought it might be possibly asthma and that I should talk to Dr Halford Chessman about it.. thank you very much

## 2019-04-13 NOTE — Progress Notes (Addendum)
Hematology/Oncology note [facility] Telephone:([phone] [phone]) [phone]   Patient Care Team: Venia Carbon, MD as PCP - General  REFERRING PROVIDER: Venia Carbon, MD  CHIEF COMPLAINTS/REASON FOR VISIT:  polycytosis  HISTORY OF PRESENTING ILLNESS:  Matthew Walter is a 60 y.o. 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  12/09/2018 labs showed elevated hemoglobin at 17.8,  total white count 8.4, platelet counts 252,000. Chronic Onset, duration since 2010 No aggravating or alleviating factors.   Associated signs or symptoms: Denies weight loss, fever, chills, fatigue, night sweats.   Context:  Smoking history: Never smoker. Testosterone supplements: Denies any use of testosterone. History of blood clots: Denies Daytime somnolence: Sometimes Family history of polycythemia: Denies  Recently some allergy/sinus problems.  Mild fatigue, at baseline.   INTERVAL HISTORY DAVEN MICHALKO is a 59 y.o. male who has above history reviewed by me today presents for follow up visit for management of polycytosis Problems and complaints are listed below: Patient reports that he establish care with Dr. Halford Chessman for sleep study.  Sleep study has been delayed due to COVID-19 pandemic. He stopped drinking sweet tea recently, eating healthy, has lost 4- 5 pounds.   Review of Systems  Constitutional: Negative for appetite change, chills, fatigue, fever and unexpected weight change.  HENT:   Negative for hearing loss and voice change.        Sinus problems  Eyes: Negative for eye problems and icterus.  Respiratory: Negative for chest tightness, cough and shortness of breath.   Cardiovascular: Negative for chest pain and leg swelling.  Gastrointestinal: Negative for abdominal distention and abdominal pain.  Endocrine: Negative for  hot flashes.  Genitourinary: Negative for difficulty urinating, dysuria and frequency.   Musculoskeletal: Negative for arthralgias.  Skin: Negative for itching and rash.  Neurological: Negative for light-headedness and numbness.  Hematological: Negative for adenopathy. Does not bruise/bleed easily.  Psychiatric/Behavioral: Negative for confusion.    MEDICAL HISTORY:  Past Medical History:  Diagnosis Date  . GERD (gastroesophageal reflux disease)   . Hyperlipidemia     SURGICAL HISTORY: Past Surgical History:  Procedure Laterality Date  . ROTATOR CUFF REPAIR Bilateral 2010--R, 2012-- L   Dr Alphonzo Cruise and then Dr Theda Sers    SOCIAL HISTORY: Social History   Socioeconomic History  . Marital status: Married    Spouse name: Not on file  . Number of children: 4  . Years of education: Not on file  . Highest education level: Not on file  Occupational History  . Occupation: Drives for [employer]  Tobacco Use  . Smoking status: Never Smoker  . Smokeless tobacco: Never Used  Substance and Sexual Activity  . Alcohol use: Yes  . Drug use: No  . Sexual activity: Not on file  Other Topics Concern  . Not on file  Social History Narrative  . Not on file   Social Determinants of Health   Financial Resource Strain:   . Difficulty of Paying Living Expenses: Not on file  Food Insecurity:   . Worried About Charity fundraiser in the Last Year: Not on file  . Ran Out of Food in the Last Year: Not on file  Transportation Needs:   . Lack of Transportation (Medical): Not on file  . Lack of Transportation (Non-Medical): Not on file  Physical Activity:   . Days of Exercise  per Week: Not on file  . Minutes of Exercise per Session: Not on file  Stress:   . Feeling of Stress : Not on file  Social Connections:   . Frequency of Communication with Friends and Family: Not on file  . Frequency of Social Gatherings with Friends and Family: Not on file  . Attends Religious Services: Not on file    . Active Member of Clubs or Organizations: Not on file  . Attends Archivist Meetings: Not on file  . Marital Status: Not on file  Intimate Partner Violence:   . Fear of Current or Ex-Partner: Not on file  . Emotionally Abused: Not on file  . Physically Abused: Not on file  . Sexually Abused: Not on file    FAMILY HISTORY: Family History  Problem Relation Age of Onset  . Healthy Mother   . Cancer Maternal Grandmother   . Cancer Maternal Grandfather   . Heart disease Father   . Cancer Maternal Uncle   . Hypertension Neg Hx   . Diabetes Neg Hx   . Colon cancer Neg Hx     ALLERGIES:  has No Known Allergies.  MEDICATIONS:  Current Outpatient Medications  Medication Sig Dispense Refill  . cetirizine (ZYRTEC) 10 MG tablet Take 10 mg by mouth daily.    . fluticasone (FLONASE) 50 MCG/ACT nasal spray Place 2 sprays into both nostrils daily.    . sildenafil (REVATIO) 20 MG tablet Take 3-5 tablets (60-100 mg total) by mouth daily as needed. 50 tablet 11  . albuterol (VENTOLIN HFA) 108 (90 Base) MCG/ACT inhaler Inhale 2 puffs into the lungs every 6 (six) hours as needed for wheezing or shortness of breath. 8 g 0   No current facility-administered medications for this visit.     PHYSICAL EXAMINATION: ECOG PERFORMANCE STATUS: 0 - Asymptomatic Vitals:   04/12/19 1339  BP: 130/81  Pulse: 73  Resp: 18  Temp: (!) 97.2 F (36.2 C)   Filed Weights   04/12/19 1339  Weight: 176 lb 14.4 oz (80.2 kg)    Physical Exam Constitutional:      General: He is not in acute distress. HENT:     Head: Normocephalic and atraumatic.  Eyes:     General: No scleral icterus.    Pupils: Pupils are equal, round, and reactive to light.  Cardiovascular:     Rate and Rhythm: Normal rate and regular rhythm.     Heart sounds: Normal heart sounds.  Pulmonary:     Effort: Pulmonary effort is normal. No respiratory distress.     Breath sounds: Wheezing present.  Abdominal:     General:  Bowel sounds are normal. There is no distension.     Palpations: Abdomen is soft. There is no mass.     Tenderness: There is no abdominal tenderness.  Musculoskeletal:        General: No deformity. Normal range of motion.     Cervical back: Normal range of motion and neck supple.  Skin:    General: Skin is warm and dry.     Findings: No erythema or rash.  Neurological:     Mental Status: He is alert and oriented to person, place, and time.     Cranial Nerves: No cranial nerve deficit.     Coordination: Coordination normal.  Psychiatric:        Behavior: Behavior normal.        Thought Content: Thought content normal.     RADIOGRAPHIC STUDIES: I  have personally reviewed the radiological images as listed and agreed with the findings in the report. DG Chest 2 View  Result Date: 04/12/2019 CLINICAL DATA:  Cough and wheezing EXAM: CHEST - 2 VIEW COMPARISON:  None. FINDINGS: The heart size and mediastinal contours are within normal limits. Both lungs are clear. Mild degenerative changes of the spine. IMPRESSION: No active cardiopulmonary disease. Electronically Signed   By: Donavan Foil M.D.   On: 04/12/2019 22:26     LABORATORY DATA:  I have reviewed the data as listed Lab Results  Component Value Date   WBC 9.5 04/12/2019   HGB 16.2 04/12/2019   HCT 49.3 04/12/2019   MCV 88.0 04/12/2019   PLT 251 04/12/2019   Recent Labs    12/09/18 1026  NA 138  K 4.0  CL 101  CO2 28  GLUCOSE 81  BUN 17  CREATININE 0.94  CALCIUM 10.1  PROT 7.8  ALBUMIN 4.7  AST 25  ALT 28  ALKPHOS 87  BILITOT 0.6   Iron/TIBC/Ferritin/ %Sat    Component Value Date/Time   IRON 67 12/20/2018 1204   TIBC 279 12/20/2018 1204   FERRITIN 113 12/20/2018 1204   IRONPCTSAT 24 12/20/2018 1204        ASSESSMENT & PLAN:  1. Erythrocytosis   2. Wheezing   3. Cough   4. Eosinophilic leukocytosis, unspecified type   Erythrocytosis #Labs are reviewed and discussed with patient. JAK 2 V617F  mutation negative, reflex to CARL, MPL, exon 12-15 all negative. 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 Hemoglobin done today has improved. May be related to his recent intentional weight loss.   # Wheezing and chronic cough,  ? Asthma. Check CXR.  Albuterol inhaler PRN.  He has seen pulmonology Dr.Sood for sleep study. I recommend patient to discuss his wheezing symptoms too. May need PFTs. History of allergic sinusitis.   Follow-up in 3 months for reevaluation.  #mildly Increased eosinophils, no increased of total WBC.  Etiology unknown.  ? Allergy. Monitor. May consider to check BCR-ABL if progressively getting worse.   Orders Placed This Encounter  Procedures  . DG Chest 2 View    Standing Status:   Future    Number of Occurrences:   1    Standing Expiration Date:   04/11/2020    Order Specific Question:   Reason for Exam (SYMPTOM  OR DIAGNOSIS REQUIRED)    Answer:   cough, wheezing    Order Specific Question:   Preferred imaging location?    Answer:   [facility]    Order Specific Question:   Radiology Contrast Protocol - do NOT remove file path    Answer:   [email or web address]    We spent sufficient time to discuss many aspect of care, questions were answered to patient's satisfaction. The patient knows to call the clinic with any problems questions or concerns.  Cc Venia Carbon, MD  Return of visit: 6 months.   Earlie Server, MD, PhD 04/13/2019

## 2019-04-20 ENCOUNTER — Other Ambulatory Visit: Payer: Self-pay

## 2019-04-20 ENCOUNTER — Ambulatory Visit: Payer: Managed Care, Other (non HMO)

## 2019-04-20 DIAGNOSIS — D751 Secondary polycythemia: Secondary | ICD-10-CM

## 2019-04-20 DIAGNOSIS — G4733 Obstructive sleep apnea (adult) (pediatric): Secondary | ICD-10-CM

## 2019-04-20 DIAGNOSIS — R0683 Snoring: Secondary | ICD-10-CM

## 2019-04-21 ENCOUNTER — Telehealth: Payer: Self-pay | Admitting: Pulmonary Disease

## 2019-04-21 DIAGNOSIS — G4733 Obstructive sleep apnea (adult) (pediatric): Secondary | ICD-10-CM

## 2019-04-21 NOTE — Telephone Encounter (Signed)
HST 04/20/19 >> AHI 10.2, SpO2 low 78%   Please inform him that his sleep study shows mild obstructive sleep apnea.  Please arrange for ROV with me or NP to discuss treatment options.

## 2019-04-24 NOTE — Telephone Encounter (Signed)
Called the patient and made him aware of the results. Patient voiced understanding. Patient was already scheduled for a follow up appointment with Dr. Halford Chessman on 05/09/19.

## 2019-05-08 NOTE — Telephone Encounter (Signed)
Called pt, the earlier call was to reschedule pt's appt with VS on 2/23.  This has already been discussed, pt aware of appt change.  Nothing further needed at this time- will close encounter.

## 2019-05-09 ENCOUNTER — Ambulatory Visit (INDEPENDENT_AMBULATORY_CARE_PROVIDER_SITE_OTHER): Payer: Managed Care, Other (non HMO)

## 2019-05-09 ENCOUNTER — Telehealth: Payer: Self-pay | Admitting: Pulmonary Disease

## 2019-05-09 ENCOUNTER — Ambulatory Visit: Payer: Managed Care, Other (non HMO) | Admitting: Pulmonary Disease

## 2019-05-09 ENCOUNTER — Other Ambulatory Visit: Payer: Self-pay

## 2019-05-09 ENCOUNTER — Encounter: Payer: Self-pay | Admitting: Pulmonary Disease

## 2019-05-09 ENCOUNTER — Ambulatory Visit (INDEPENDENT_AMBULATORY_CARE_PROVIDER_SITE_OTHER): Payer: Managed Care, Other (non HMO) | Admitting: Pulmonary Disease

## 2019-05-09 VITALS — BP 144/80 | HR 106 | Temp 98.5°F | Ht 67.0 in | Wt 175.0 lb

## 2019-05-09 DIAGNOSIS — J410 Simple chronic bronchitis: Secondary | ICD-10-CM

## 2019-05-09 DIAGNOSIS — G4733 Obstructive sleep apnea (adult) (pediatric): Secondary | ICD-10-CM | POA: Diagnosis not present

## 2019-05-09 DIAGNOSIS — J4540 Moderate persistent asthma, uncomplicated: Secondary | ICD-10-CM

## 2019-05-09 DIAGNOSIS — J45909 Unspecified asthma, uncomplicated: Secondary | ICD-10-CM | POA: Insufficient documentation

## 2019-05-09 MED ORDER — PREDNISONE 10 MG PO TABS: ORAL_TABLET | ORAL | 0 refills | Status: DC

## 2019-05-09 MED ORDER — BUDESONIDE-FORMOTEROL FUMARATE 80-4.5 MCG/ACT IN AERO: 2.0000 | INHALATION_SPRAY | Freq: Two times a day (BID) | RESPIRATORY_TRACT | 3 refills | Status: DC

## 2019-05-09 NOTE — Progress Notes (Signed)
   Subjective:    Patient ID: Matthew Walter, male    DOB: [birth year], 59 y.o.   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  He has persistently elevated hemoglobin 16-18 range  Today he presents for a different problem, since his last visit he has developed dry cough and wheezing which especially increases at night.  He shows me a picture of brown rusty sputum that he produces every morning.  He had to call in sick this week due to increased wheezing and dyspnea and he never called sick. He has been using albuterol MDI almost every 6 hours which was given to him by his PCP.  He denies childhood history of asthma. He has had occasional reflux symptoms in the past but denies persistent heartburn or bloating. He reports perennial allergies for which he takes Zyrtec and [employer] -no changes in his bedroom, no new blankets, pillows or carpets.  He has 1 cat for many years   Chest x-ray 1/27 reviewed, clear lungs Chest x-ray 2/23 no new infiltrates  Reviewed home sleep test, reviewed labs his hemoglobin has decreased to 16  Significant tests/ events reviewed  HST 04/20/19 >> AHI 10.2, SpO2 low 78% , worse when supine 19/hour  Review of Systems neg for any significant sore throat, dysphagia, itching, sneezing, nasal congestion or excess/ purulent secretions, fever, chills, sweats, unintended wt loss, pleuritic or exertional cp, hempoptysis, orthopnea pnd or change in chronic leg swelling. Also denies presyncope, palpitations, heartburn, abdominal pain, nausea, vomiting, diarrhea or change in bowel or urinary habits, dysuria,hematuria, rash, arthralgias, visual complaints, headache, numbness weakness or ataxia.     Objective:   Physical Exam   Gen. Pleasant, well-nourished, in no distress ENT - no thrush, no pallor/icterus,no post nasal drip Neck: No JVD, no  thyromegaly, no carotid bruits Lungs: no use of accessory muscles, no dullness to percussion, bilateral scattered rhonchi Cardiovascular: Rhythm regular, heart sounds  normal, no murmurs or gallops, no peripheral edema Musculoskeletal: No deformities, no cyanosis or clubbing         Assessment & Plan:

## 2019-05-09 NOTE — Assessment & Plan Note (Signed)
Appears to be mild degree, moderate when supine.  I doubt that this is contributing to polycythemia He does not have cardiovascular risk factors  Only reason to treat his if he is excessively somnolent, but he denies this. We decided to hold off treatment for now, can consider oral appliance in the future Main focus now would be positional therapy, avoid sleeping in supine position-we discussed different ways of doing this  Advised against medications with sedative side effects Cautioned against driving when sleepy - understanding that sleepiness will vary on a day to day basis

## 2019-05-09 NOTE — Telephone Encounter (Signed)
Called pt's pharmacy and spoke with [place]. Per Merrily Pew, [employer] will not pay for generic but would pay for the name brand Symbicort so that is what they filled for pt and pt already picked up the inhaler. Nothing further needed.

## 2019-05-09 NOTE — Assessment & Plan Note (Signed)
This appears to be of moderate degree and persistent over the last 8 to 10 weeks. Trigger is unclear-could be related to allergies or to GERD.  No trigger apparent in his environment  Even though he is rusty sputum he does not have an infiltrate and recent labs did not show leukocytosis so doubt pneumonia or acute infection.  Not have any risk factors for Covid and no sick contacts  We will treat him with a  steroid course and escalate to steroid/LABA combination Prednisone 10 mg tabs Take 4 tabs  daily with food x 4 days, then 3 tabs daily x 4 days, then 2 tabs daily x 4 days, then 1 tab daily x4 days then stop. #40  Sample of Symbicort 80-2 puffs twice daily, rinse mouth after use , call for prescription if this works Use albuterol every 6 hours as needed for wheezing in between  Call me back if no better in 2 weeks  If persistent, can consider RAST testing

## 2019-05-09 NOTE — Patient Instructions (Addendum)
Chest x-ray today  Prednisone 10 mg tabs Take 4 tabs  daily with food x 4 days, then 3 tabs daily x 4 days, then 2 tabs daily x 4 days, then 1 tab daily x4 days then stop. #40  Sample of Symbicort 80-2 puffs twice daily, rinse mouth after use , call for prescription if this works Use albuterol every 6 hours as needed for wheezing in between  Call me back if no better in 2 weeks  You have mild OSA-worse when you lie on your back Try to use a tennis ball under the covers and avoid sleeping on your back Will hold off treatment for now

## 2019-05-24 ENCOUNTER — Telehealth: Payer: Self-pay | Admitting: Pulmonary Disease

## 2019-05-24 NOTE — Telephone Encounter (Signed)
LMTCB x 1 

## 2019-05-24 NOTE — Telephone Encounter (Signed)
Spoke with patient. He stated that he was prescribed Symbicort and a prednisone taper on 05/09/19. He finished the prednisone taper yesterday and has noticed the entire he was on the prednisone and Symbicort, his hand tremors have increased. He had some tremors before beginning this medications but they are now worse. He notices the tremors more with fine motor skills. Denied any chest pain, worsening SOB, sore throat or feeling like his throat was about to close up. Also denied being on any other new medications.   He did state that cough and wheezing he had is completely gone.   RA, please advise. Thanks!

## 2019-05-24 NOTE — Telephone Encounter (Signed)
This may be a side effect of Symbicort and albuterol. Have him decrease Symbicort to 1 puff twice daily and use albuterol only if absolutely necessary for wheezing

## 2019-05-24 NOTE — Telephone Encounter (Signed)
Spoke with patient. He verbalized understanding. Advised him to keep his appt with RA scheduled on 3/23 but to call us before then if he is still having symptoms.   Nothing further needed at time of call.

## 2019-05-24 NOTE — Telephone Encounter (Signed)
Pt called back. Please return call.  

## 2019-06-05 ENCOUNTER — Ambulatory Visit: Payer: Managed Care, Other (non HMO) | Admitting: Pulmonary Disease

## 2019-06-06 ENCOUNTER — Encounter: Payer: Self-pay | Admitting: Pulmonary Disease

## 2019-06-06 ENCOUNTER — Other Ambulatory Visit: Payer: Self-pay

## 2019-06-06 ENCOUNTER — Ambulatory Visit (INDEPENDENT_AMBULATORY_CARE_PROVIDER_SITE_OTHER): Payer: Managed Care, Other (non HMO) | Admitting: Pulmonary Disease

## 2019-06-06 DIAGNOSIS — G4733 Obstructive sleep apnea (adult) (pediatric): Secondary | ICD-10-CM

## 2019-06-06 DIAGNOSIS — J4530 Mild persistent asthma, uncomplicated: Secondary | ICD-10-CM | POA: Diagnosis not present

## 2019-06-06 NOTE — Assessment & Plan Note (Signed)
Much improved-trigger unclear , no obvious allergies or reflux Plan here is to continue steroid/LABA for 3 months and then attempt to stepdown and see if symptoms return.  If symptoms return then will consider allergy testing Okay to continue Symbicort 1 puff twice daily until end of May, then stop Rinse mouth after use  Stay on Zyrtec and Flonase during spring and fall

## 2019-06-06 NOTE — Progress Notes (Signed)
   Subjective:    Patient ID: Matthew Walter, male    DOB: [birth year], 59 y.o.   MRN: [id number]  HPI  59 year old with OSA, polycythemia and reactive airway disease  Chief Complaint  Patient presents with  . Follow-up    Patient is feeling better since last visit. Cough is gone but is still clearing throat. Decreased to 1 puff of Symbicort in morning and 1 puff at night seems to be helping with the shaking.   Presented in 2/21 with dry cough, wheezing and rusty sputum x 2 months , no childhood history of asthma. We treated him with a  steroid course and escalate to steroid/LABA combination Feels remarkably better, coughing is resolved, no sputum wheezing is gone is able to sleep through the night.  He still has occasional throat clearing. Incidentally his mother, 26 years old, lives in [place] has exactly the same symptoms. He has been living in [place] for 16 years  Again denies obvious reflux symptoms  Symbicort is expensive, $100 co-pay  Significant tests/ events reviewed HST 04/20/19 >> AHI 10.2, SpO2 low 78% , worse when supine 19/hour  Review of Systems Patient denies significant dyspnea,cough, hemoptysis,  chest pain, palpitations, pedal edema, orthopnea, paroxysmal nocturnal dyspnea, lightheadedness, nausea, vomiting, abdominal or  leg pains      Objective:   Physical Exam  Gen. Pleasant, well-nourished, in no distress ENT - no thrush, no pallor/icterus,no post nasal drip Neck: No JVD, no thyromegaly, no carotid bruits Lungs: no use of accessory muscles, no dullness to percussion, clear without rales or rhonchi  Cardiovascular: Rhythm regular, heart sounds  normal, no murmurs or gallops, no peripheral edema Musculoskeletal: No deformities, no cyanosis or clubbing         Assessment & Plan:

## 2019-06-06 NOTE — Assessment & Plan Note (Signed)
Mild and defer treatment unless polycythemia or symptoms worsen

## 2019-06-06 NOTE — Patient Instructions (Signed)
  Okay to continue Symbicort 1 puff twice daily until end of May, then stop Rinse mouth after use  Stay on Zyrtec and Flonase

## 2019-09-19 ENCOUNTER — Telehealth: Payer: Self-pay | Admitting: Oncology

## 2019-09-19 ENCOUNTER — Ambulatory Visit (INDEPENDENT_AMBULATORY_CARE_PROVIDER_SITE_OTHER): Payer: Managed Care, Other (non HMO) | Admitting: Pulmonary Disease

## 2019-09-19 ENCOUNTER — Encounter: Payer: Self-pay | Admitting: Pulmonary Disease

## 2019-09-19 ENCOUNTER — Other Ambulatory Visit: Payer: Self-pay

## 2019-09-19 VITALS — BP 128/78 | HR 80 | Temp 98.0°F | Ht 67.0 in | Wt 179.8 lb

## 2019-09-19 DIAGNOSIS — J4530 Mild persistent asthma, uncomplicated: Secondary | ICD-10-CM

## 2019-09-19 DIAGNOSIS — G4733 Obstructive sleep apnea (adult) (pediatric): Secondary | ICD-10-CM

## 2019-09-19 MED ORDER — FLOVENT HFA 110 MCG/ACT IN AERO: 1.0000 | INHALATION_SPRAY | Freq: Two times a day (BID) | RESPIRATORY_TRACT | 3 refills | Status: DC

## 2019-09-19 MED ORDER — ALBUTEROL SULFATE HFA 108 (90 BASE) MCG/ACT IN AERS: 2.0000 | INHALATION_SPRAY | Freq: Four times a day (QID) | RESPIRATORY_TRACT | 3 refills | Status: DC | PRN

## 2019-09-19 NOTE — Assessment & Plan Note (Signed)
Unable to identify clear trigger, he does seem to need maintenance medication based on recurrence of symptoms once Symbicort was tapered off. At the same time LABA seems to cause increased tremors and this will stepdown to inhaled steroids only Triggers possibly include allergies  Trial of Flovent 88 -1 puff twice daily, rinse mouth after use If this MDI is expensive, we can provide alternative based on your insurance.  Use albuterol 2 puffs every 6 hours as needed for wheezing.  Schedule spirometry pre and post. RAST panel

## 2019-09-19 NOTE — Patient Instructions (Signed)
Trial of Flovent 88 -1 puff twice daily, rinse mouth after use If this MDI is expensive, we can provide alternative based on your insurance.  Use albuterol 2 puffs every 6 hours as needed for wheezing.  Schedule spirometry pre and post. RAST panel

## 2019-09-19 NOTE — Progress Notes (Signed)
   Subjective:    Patient ID: Matthew Walter, male    DOB: 09-25-1960, 59 y.o.   MRN: [id number]  HPI   59 yo with OSA, polycythemia and reactive airway disease  Presented in 04/2019 with dry cough, wheezing and rusty sputum x 2 months , no childhood history of asthma. We treated him with a steroid course and escalated to steroid/LABA combination  Chief Complaint  Patient presents with  . Follow-up    cough is starting to come back since going off symbicort but not as severe as prior   Symbicort caused tremors, he tapered this off gradually and has now been off for 2 weeks Reports cough is coming back no wheezing yet in over close to 6 he was back in February/March Again we discussed  his environment, he is a [employer] carrier, has a cat for 10 years  No obvious reflux  Significant tests/ events reviewed HST 04/20/19 >> AHI 10.2, SpO2 low 78%,worse when supine 19/hour   Review of Systems neg for any significant sore throat, dysphagia, itching, sneezing, nasal congestion or excess/ purulent secretions, fever, chills, sweats, unintended wt loss, pleuritic or exertional cp, hempoptysis, orthopnea pnd or change in chronic leg swelling. Also denies presyncope, palpitations, heartburn, abdominal pain, nausea, vomiting, diarrhea or change in bowel or urinary habits, dysuria,hematuria, rash, arthralgias, visual complaints, headache, numbness weakness or ataxia.     Objective:   Physical Exam  Gen. Pleasant, well-nourished, in no distress ENT - no thrush, no pallor/icterus,no post nasal drip Neck: No JVD, no thyromegaly, no carotid bruits Lungs: no use of accessory muscles, no dullness to percussion, clear without rales or rhonchi  Cardiovascular: Rhythm regular, heart sounds  normal, no murmurs or gallops, no peripheral edema Musculoskeletal: No deformities, no cyanosis or clubbing        Assessment & Plan:

## 2019-09-19 NOTE — Telephone Encounter (Signed)
Patient phoned on this date and stated that he needed to cancel his appts on 10-09-19 as his work schedule changed. He stated that he would need to call back at a later time to reschedule when he gets a better idea as to how his schedule will be.

## 2019-09-19 NOTE — Assessment & Plan Note (Signed)
Mild and relatively asymptomatic so positional therapy only

## 2019-09-20 LAB — RESPIRATORY ALLERGY PROFILE REGION II ~~LOC~~

## 2019-09-20 LAB — INTERPRETATION:

## 2019-09-20 NOTE — Telephone Encounter (Signed)
Dr. Alva please advise. Thanks. 

## 2019-09-21 ENCOUNTER — Telehealth: Payer: Self-pay | Admitting: Pulmonary Disease

## 2019-09-21 NOTE — Telephone Encounter (Signed)
Please check for alternatives to Flovent -Pulmicort, discussed, Qvar , any steroid inhaler should be okay

## 2019-09-21 NOTE — Progress Notes (Signed)
Spoke with pt and notified of results per Dr. Alva. Pt verbalized understanding and denied any questions. 

## 2019-09-21 NOTE — Telephone Encounter (Signed)
Results reviewed with patient, see result note.

## 2019-10-09 ENCOUNTER — Other Ambulatory Visit: Payer: Managed Care, Other (non HMO)

## 2019-10-09 ENCOUNTER — Ambulatory Visit: Payer: Managed Care, Other (non HMO) | Admitting: Oncology

## 2019-12-13 ENCOUNTER — Encounter: Payer: Managed Care, Other (non HMO) | Admitting: Internal Medicine

## 2020-02-21 ENCOUNTER — Other Ambulatory Visit: Payer: Self-pay | Admitting: Pulmonary Disease

## 2021-11-25 ENCOUNTER — Encounter: Payer: Self-pay | Admitting: Internal Medicine

## 2021-11-25 ENCOUNTER — Ambulatory Visit: Payer: 59 | Admitting: Internal Medicine

## 2021-11-25 VITALS — BP 128/76 | HR 88 | Temp 98.1°F | Ht 68.0 in | Wt 174.0 lb

## 2021-11-25 DIAGNOSIS — E785 Hyperlipidemia, unspecified: Secondary | ICD-10-CM

## 2021-11-25 DIAGNOSIS — J4520 Mild intermittent asthma, uncomplicated: Secondary | ICD-10-CM | POA: Diagnosis not present

## 2021-11-25 DIAGNOSIS — Z0000 Encounter for general adult medical examination without abnormal findings: Secondary | ICD-10-CM

## 2021-11-25 DIAGNOSIS — Z125 Encounter for screening for malignant neoplasm of prostate: Secondary | ICD-10-CM | POA: Diagnosis not present

## 2021-11-25 LAB — COMPREHENSIVE METABOLIC PANEL
ALT: 30 U/L (ref 0–53)
AST: 27 U/L (ref 0–37)
Albumin: 4.4 g/dL (ref 3.5–5.2)
Alkaline Phosphatase: 88 U/L (ref 39–117)
BUN: 15 mg/dL (ref 6–23)
CO2: 28 mEq/L (ref 19–32)
Calcium: 9.6 mg/dL (ref 8.4–10.5)
Chloride: 100 mEq/L (ref 96–112)
Creatinine, Ser: 0.98 mg/dL (ref 0.40–1.50)
GFR: 84.44 mL/min (ref >60.00)
Glucose, Bld: 93 mg/dL (ref 70–99)
Potassium: 4.1 mEq/L (ref 3.5–5.1)
Sodium: 137 mEq/L (ref 135–145)
Total Bilirubin: 0.7 mg/dL (ref 0.2–1.2)
Total Protein: 7.3 g/dL (ref 6.0–8.3)

## 2021-11-25 LAB — CBC
HCT: 50.1 % (ref 39.0–52.0)
Hemoglobin: 16.8 g/dL (ref 13.0–17.0)
MCHC: 33.4 g/dL (ref 30.0–36.0)
MCV: 88.6 fl (ref 78.0–100.0)
Platelets: 274 10*3/uL (ref 150.0–400.0)
RBC: 5.66 Mil/uL (ref 4.22–5.81)
RDW: 14.1 % (ref 11.5–15.5)
WBC: 8.1 10*3/uL (ref 4.0–10.5)

## 2021-11-25 LAB — LIPID PANEL
Cholesterol: 201 mg/dL — ABNORMAL HIGH (ref 0–200)
HDL: 42.2 mg/dL (ref >39.00)
LDL Cholesterol: 145 mg/dL — ABNORMAL HIGH (ref 0–99)
NonHDL: 158.78
Total CHOL/HDL Ratio: 5
Triglycerides: 70 mg/dL (ref 0.0–149.0)
VLDL: 14 mg/dL (ref 0.0–40.0)

## 2021-11-25 LAB — PSA: PSA: 3.04 ng/mL (ref 0.10–4.00)

## 2021-11-25 NOTE — Assessment & Plan Note (Signed)
Not excited about meds Will recheck

## 2021-11-25 NOTE — Assessment & Plan Note (Addendum)
Healthy BP seems to be okay Discussed fitness Prefers to not have any vaccines Overdue for colonoscopy--will contact Dr Henrene Pastor Will check PSA after check

## 2021-11-25 NOTE — Progress Notes (Signed)
Subjective:    Patient ID: Matthew Walter, male    DOB: [birth year], 61 y.o.   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     sildenafil (REVATIO) 20 MG tablet Take 3-5 tablets (60-100 mg total) by mouth daily as needed. 50 tablet 11   No current facility-administered medications on file prior to visit.    No Known Allergies  Past Medical History:  Diagnosis Date   GERD (gastroesophageal reflux disease)    Hyperlipidemia     Past Surgical History:  Procedure Laterality Date   ROTATOR CUFF REPAIR Bilateral 2010--R, 2012-- L   Dr Alphonzo Cruise and then Dr Theda Sers    Family History  Problem Relation Age of Onset   Healthy Mother    Cancer Maternal Grandmother    Cancer Maternal Grandfather    Heart disease Father    Cancer Maternal Uncle    Hypertension Neg Hx    Diabetes Neg Hx    Colon cancer Neg Hx     Social History   Socioeconomic History   Marital status: Married    Spouse name: Not on file   Number of children: 4   Years of education: Not on file   Highest education level: Not on file  Occupational History   Occupation: Drives for [employer]  Tobacco Use   Smoking status: Never    Passive exposure: Past   Smokeless tobacco: Never  Vaping Use   Vaping Use: Never used  Substance and Sexual Activity   Alcohol use: Yes   Drug use: No   Sexual activity: Not on file  Other Topics Concern   Not on file  Social History Narrative   Not on file   Social Determinants of Health   Financial Resource Strain: Not on file   Food Insecurity: Not on file  Transportation Needs: Not on file  Physical Activity: Not on file  Stress: Not on file  Social Connections: Not on file  Intimate Partner Violence: Not on file   Review of Systems  Constitutional:  Negative for fatigue and unexpected weight change.       Not really exercising Wears seat belt  HENT:  Negative for hearing loss and tinnitus.        Needs periodontal work  Eyes:        No diplopia or unilateral vision loss Did have some floaters---exam benign  Respiratory:  Negative for cough, chest tightness and shortness of breath.   Cardiovascular:  Negative for chest pain, palpitations and leg swelling.  Gastrointestinal:  Negative for blood in stool and constipation.       No heartburn  Endocrine: Negative for polydipsia and polyuria.  Genitourinary:  Negative for urgency.       Flow slow at times Nocturia is only occasional Some ED---sildenafil helps, but libido is down  Musculoskeletal:  Negative for arthralgias, back pain and joint swelling.  Skin:  Negative for rash.  Allergic/Immunologic: Positive for environmental allergies. Negative for immunocompromised state.  Zyrtec helps---spring/fall  Neurological:  Negative for dizziness, syncope and light-headedness.       Some sinus headaches  Hematological:  Negative for adenopathy. Does not bruise/bleed easily.  Psychiatric/Behavioral:  Negative for dysphoric mood and sleep disturbance. The patient is not nervous/anxious.        Objective:   Physical Exam Constitutional:      Appearance: Normal appearance.  HENT:     Mouth/Throat:     Pharynx: No oropharyngeal exudate or posterior oropharyngeal erythema.  Eyes:     Conjunctiva/sclera: Conjunctivae normal.     Pupils: Pupils are equal, round, and reactive to light.  Cardiovascular:     Rate and Rhythm: Normal rate and regular rhythm.     Pulses: Normal pulses.     Heart sounds: No murmur heard.    No gallop.  Pulmonary:      Effort: Pulmonary effort is normal.     Breath sounds: Normal breath sounds. No wheezing or rales.  Abdominal:     Palpations: Abdomen is soft.     Tenderness: There is no abdominal tenderness.  Musculoskeletal:     Cervical back: Neck supple.     Right lower leg: No edema.     Left lower leg: No edema.  Lymphadenopathy:     Cervical: No cervical adenopathy.  Skin:    Findings: No lesion or rash.     Comments: Multiple large--but benign nevi (advised getting back with derm for regular checks)  Neurological:     General: No focal deficit present.     Mental Status: He is alert and oriented to person, place, and time.  Psychiatric:        Mood and Affect: Mood normal.        Behavior: Behavior normal.            Assessment & Plan:

## 2021-11-25 NOTE — Assessment & Plan Note (Signed)
Doing better now Off the inhalers

## 2021-12-03 ENCOUNTER — Encounter: Payer: Self-pay | Admitting: Internal Medicine

## 2021-12-03 ENCOUNTER — Telehealth: Payer: Self-pay

## 2021-12-03 NOTE — Telephone Encounter (Signed)
Please schedule pt for direct colon in the [facility] per Dr. Henrene Pastor, see note below.     This patient is due for surveillance colonoscopy "history of polyps".  Can be direct to the [facility].  Please assist.  Thanks,  JP

## 2021-12-03 NOTE — Telephone Encounter (Signed)
Patient has been scheduled for PV on 10/30 at 10:00 and colon scheduled for 11/27 at 8:30

## 2021-12-14 ENCOUNTER — Encounter: Payer: Self-pay | Admitting: Internal Medicine

## 2021-12-14 DIAGNOSIS — L989 Disorder of the skin and subcutaneous tissue, unspecified: Secondary | ICD-10-CM

## 2021-12-16 IMAGING — CR DG CHEST 2V
1 series · 2 of 2 positions shown · non-contrast
Comparison: None.

CLINICAL DATA: Cough and wheezing

EXAM:
CHEST - 2 VIEW

[Series 1: dg chest 2 view · 0.14mm/px · 2 of 2 slices shown]
[im 1/2]
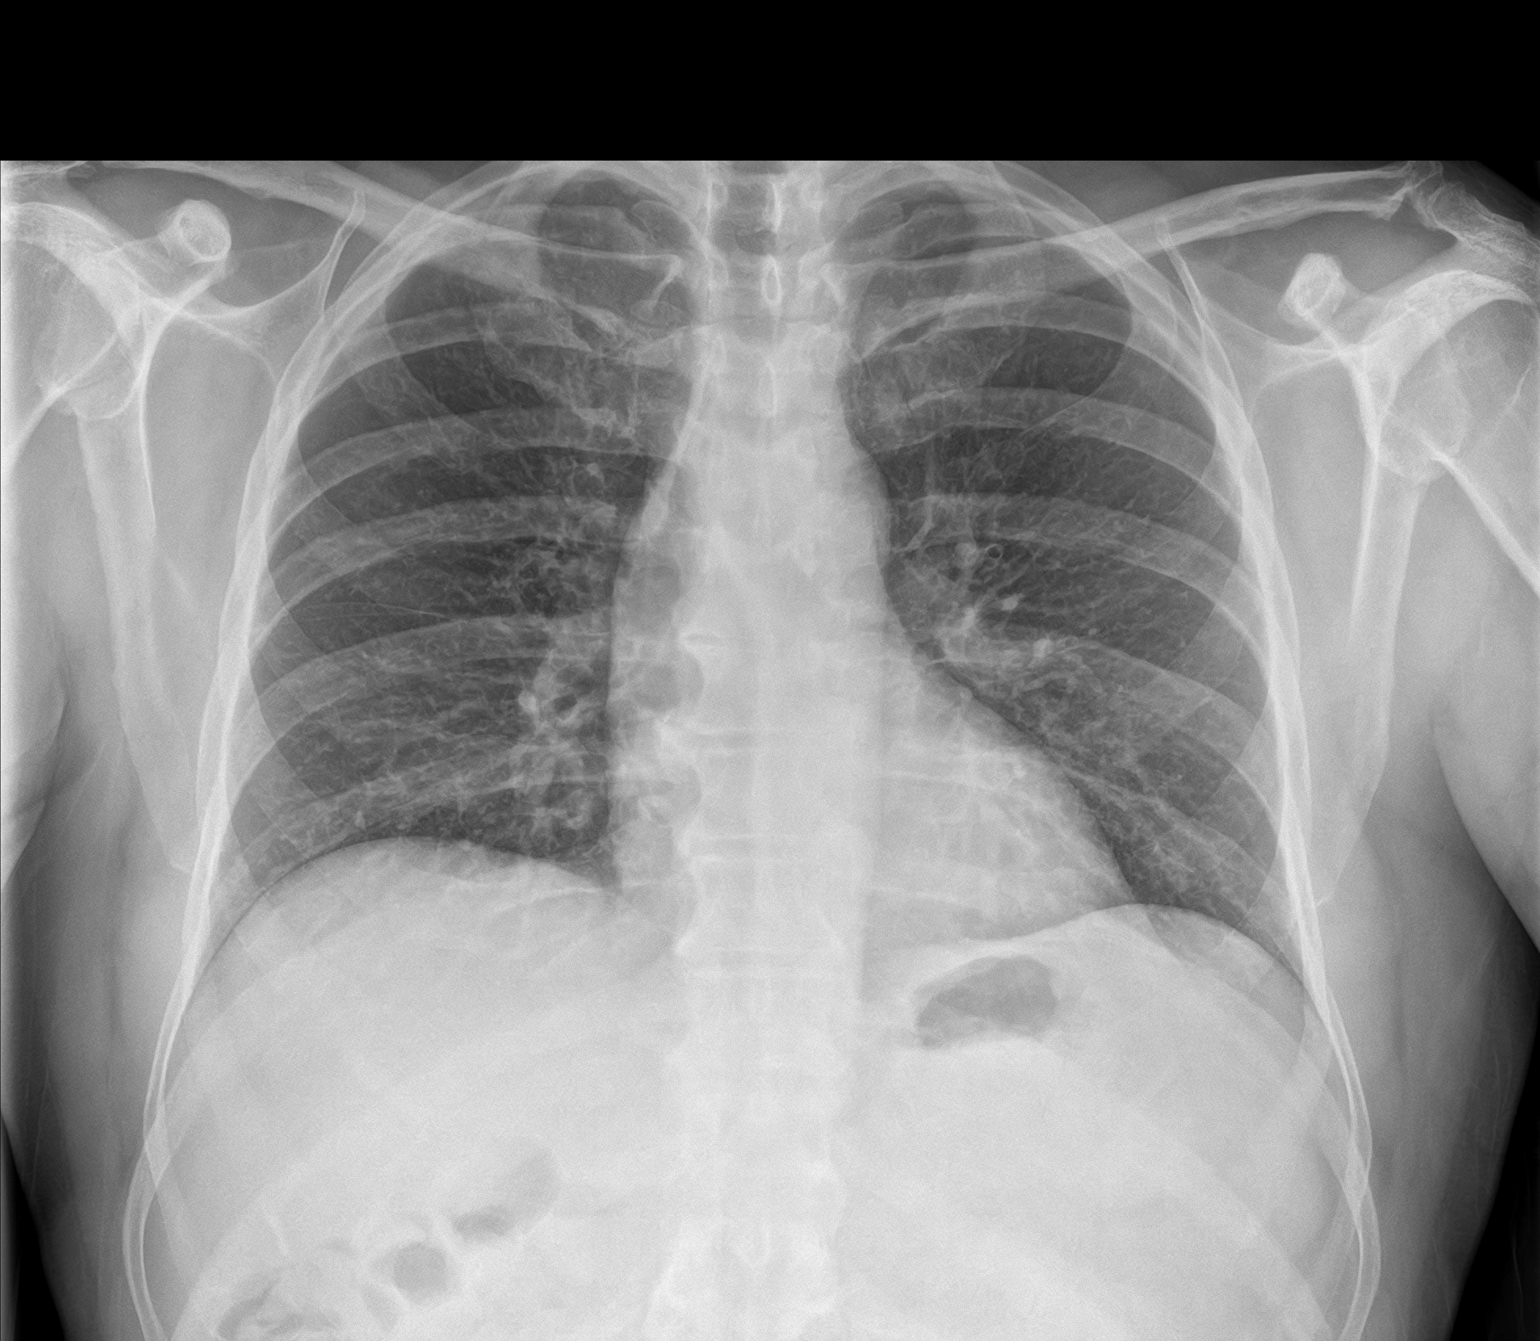
[im 2/2]
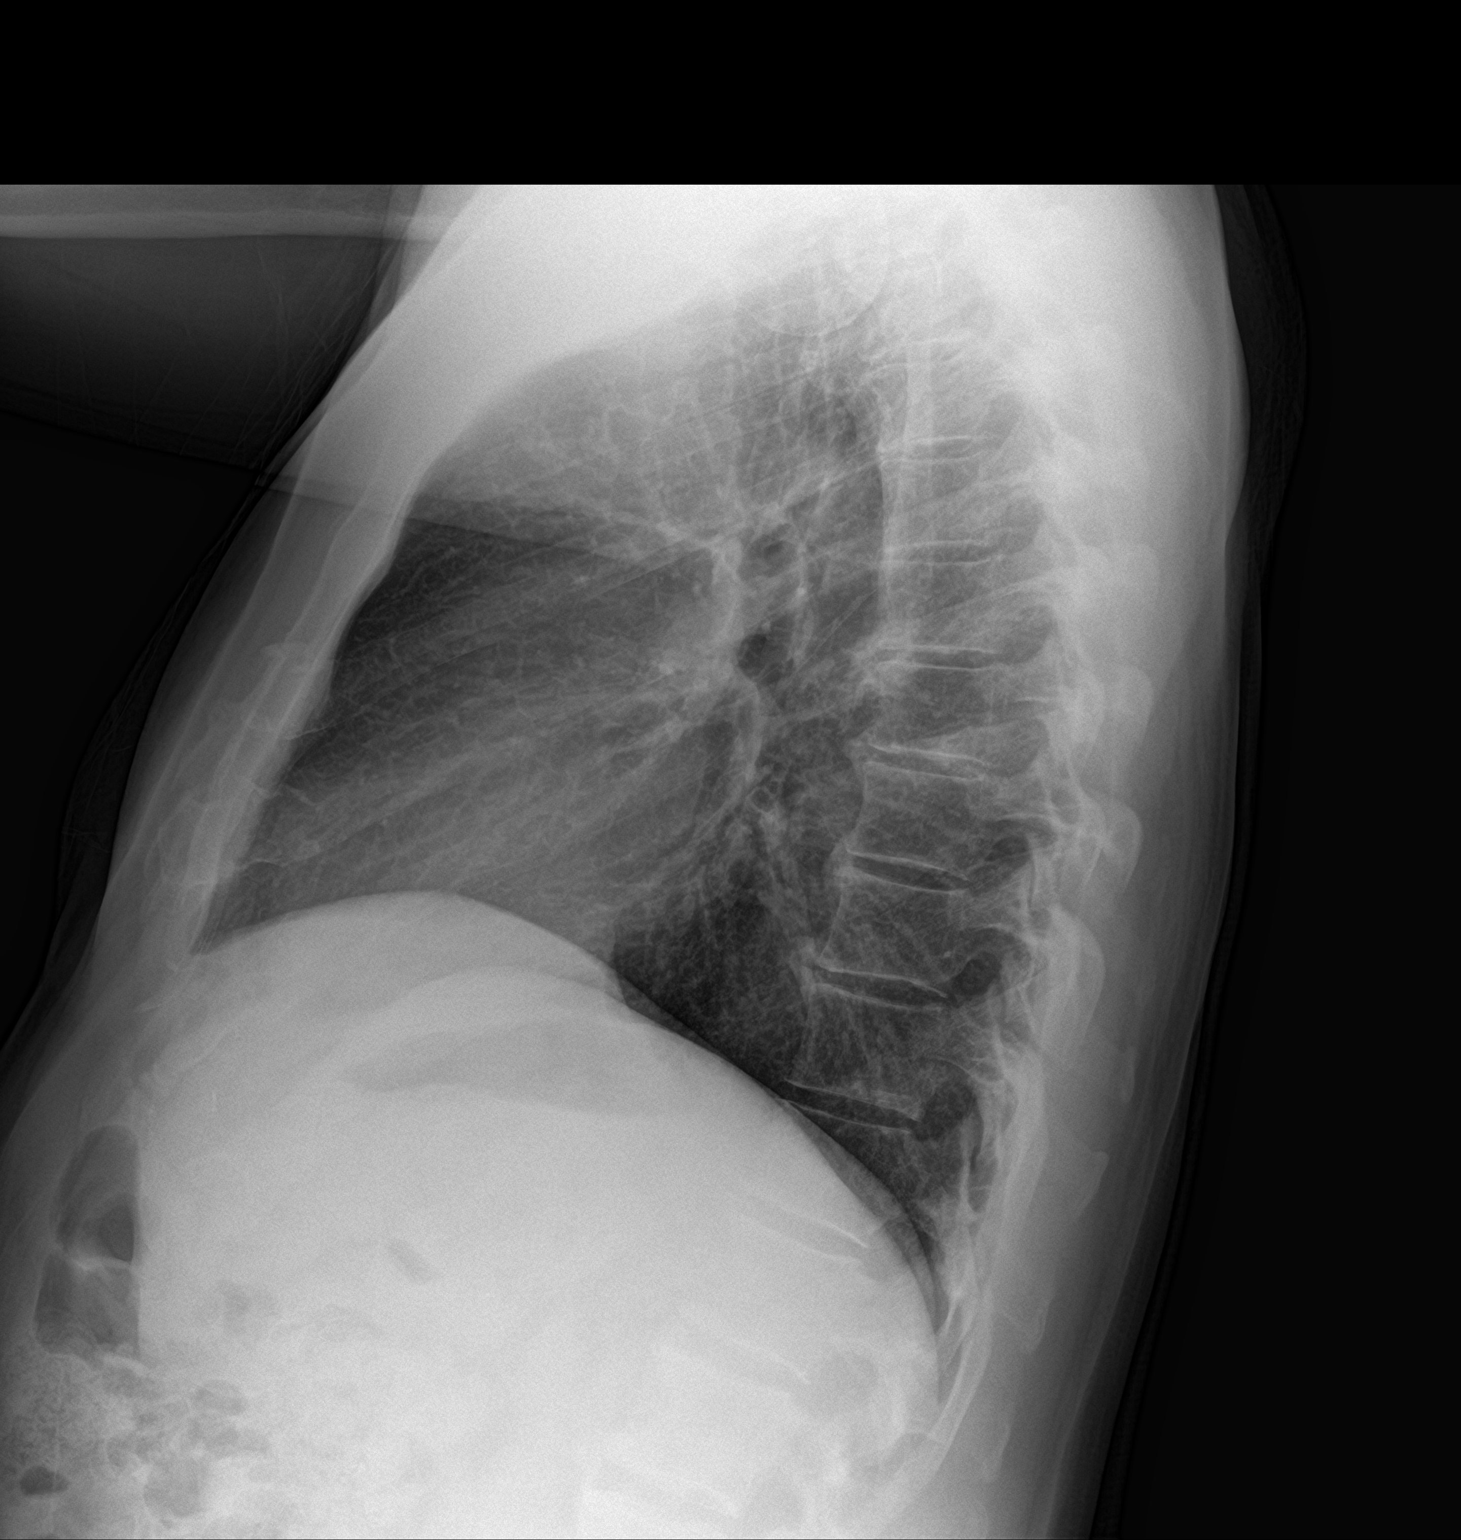

[2 of 2 positions shown; findings below may reference images not displayed]

FINDINGS: The heart size and mediastinal contours are within normal limits.
Both lungs are clear. Mild degenerative changes of the spine.
IMPRESSION: No active cardiopulmonary disease.

## 2022-01-12 ENCOUNTER — Encounter: Payer: Self-pay | Admitting: Internal Medicine

## 2022-01-12 ENCOUNTER — Telehealth: Payer: Self-pay | Admitting: Internal Medicine

## 2022-01-12 ENCOUNTER — Other Ambulatory Visit: Payer: Self-pay

## 2022-01-12 ENCOUNTER — Ambulatory Visit (INDEPENDENT_AMBULATORY_CARE_PROVIDER_SITE_OTHER): Payer: 59 | Admitting: Internal Medicine

## 2022-01-12 ENCOUNTER — Ambulatory Visit (AMBULATORY_SURGERY_CENTER): Payer: Self-pay | Admitting: *Deleted

## 2022-01-12 VITALS — BP 138/76 | HR 93 | Temp 98.4°F | Ht 68.0 in | Wt 175.0 lb

## 2022-01-12 VITALS — Ht 67.0 in | Wt 170.0 lb

## 2022-01-12 DIAGNOSIS — M2550 Pain in unspecified joint: Secondary | ICD-10-CM | POA: Insufficient documentation

## 2022-01-12 DIAGNOSIS — Z86010 Personal history of colonic polyps: Secondary | ICD-10-CM

## 2022-01-12 MED ORDER — NA SULFATE-K SULFATE-MG SULF 17.5-3.13-1.6 GM/177ML PO SOLN: 1.0000 | Freq: Once | ORAL | 0 refills | Status: AC

## 2022-01-12 MED ORDER — MELOXICAM 7.5 MG PO TABS: 7.5000 mg | ORAL_TABLET | Freq: Every day | ORAL | 1 refills | Status: DC

## 2022-01-12 NOTE — Progress Notes (Signed)
Subjective:    Patient ID: Matthew Walter, male    DOB: [birth year], 61 y.o.   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 He noticed wrist pain at the end of his shift and couldn't even grip the steering wheel Kept up at night from the left wrist Went the next day to Dr Bebe Shaggy wrong with neck  Went to trainer at [employer]---got stretches to do, biofreeze. He got some wrist braces as well. Seemed to help some Then noticed pain in right knee---later in the back of the left knee Later the right elbow---"out of no where" Some hand strength problems--trouble making a fist in hands at times (not consistent) Then he just noticed pain on the inside of both ankles--2 days ago. Better by the next day  [place]--has to spray for ticks Often find ticks---he and wife will check and take them off Wondered about Lyme disease Current Outpatient Medications on File Prior to Visit  Medication Sig Dispense Refill   cetirizine (ZYRTEC) 10 MG tablet Take 10 mg by mouth daily.     Na Sulfate-K Sulfate-Mg Sulf 17.5-3.13-1.6 GM/177ML SOLN Take 1 kit by mouth once for 1 dose. 1 mL 0   sildenafil (REVATIO) 20 MG tablet Take 3-5 tablets (60-100 mg total) by mouth daily as needed. 50 tablet 11   No current facility-administered medications on file prior to visit.    No Known Allergies  Past Medical History:  Diagnosis Date   Allergy    GERD (gastroesophageal reflux disease)    Hyperlipidemia     Past Surgical History:  Procedure Laterality Date   COLONOSCOPY     ROTATOR CUFF REPAIR Bilateral 2010--R, 2012-- L   Dr Alphonzo Cruise and then Dr Theda Sers    Family History  Problem Relation Age of Onset   Healthy Mother    Cancer Maternal Grandmother    Cancer Maternal Grandfather    Heart disease Father    Cancer Maternal  Uncle    Hypertension Neg Hx    Diabetes Neg Hx    Colon cancer Neg Hx     Social History   Socioeconomic History   Marital status: Married    Spouse name: Not on file   Number of children: 4   Years of education: Not on file   Highest education level: Not on file  Occupational History   Occupation: Drives for [employer]  Tobacco Use   Smoking status: Never    Passive exposure: Past   Smokeless tobacco: Never  Vaping Use   Vaping Use: Never used  Substance and Sexual Activity   Alcohol use: Yes   Drug use: No   Sexual activity: Not on file  Other Topics Concern   Not on file  Social History Narrative   Not on file   Social Determinants of Health   Financial Resource Strain: Not on file  Food Insecurity: Not on file  Transportation Needs: Not on file  Physical Activity: Not on file  Stress: Not on file  Social Connections: Not on file  Intimate Partner Violence: Not on file   Review of Systems Went to derm today---to check moles Colonoscopy is now scheduled No photosensitivity No dysphagia No rashes    Objective:   Physical Exam Constitutional:      Appearance:  Normal appearance.  Musculoskeletal:     Comments: Very slight swelling in hand PIPs--but no true synovitis All other joints are negative  Neurological:     Mental Status: He is alert.            Assessment & Plan:

## 2022-01-12 NOTE — Patient Instructions (Signed)
You can try diclofenac gel (over the counter) for your joint pains. You can also take the prescription pills when it is worse or widespread.

## 2022-01-12 NOTE — Assessment & Plan Note (Addendum)
Some hand swelling but no clear true synovitis I suspect OA only--but will check RF and sed rate Rx for meloxicam 7.5 bid prn Try topical diclofenac for focal pain

## 2022-01-12 NOTE — Telephone Encounter (Signed)
Spoke to pt. Decided it was best that he keep the appt he has this afternoon so Dr Silvio Pate can evaluate.

## 2022-01-12 NOTE — Progress Notes (Signed)
Pre visit completed in person.  No egg or soy allergy known to patient  No issues known to pt with past sedation with any surgeries or procedures Patient denies ever being told they had issues or difficulty with intubation  No FH of Malignant Hyperthermia Pt is not on diet pills Pt is not on  home 02  Pt is not on blood thinners  Pt denies issues with constipation  No A fib or A flutter  Pt instructed to use Singlecare.com or GoodRx for a price reduction on prep   

## 2022-01-12 NOTE — Telephone Encounter (Signed)
Patient called in stating that he has been having some pain in his knees, ankles, and other joints. He is scheduled to see Dr. Silvio Pate but wanted to know if that was necessary or what he should do. Thank you!

## 2022-01-13 ENCOUNTER — Encounter: Payer: Self-pay | Admitting: Internal Medicine

## 2022-01-13 LAB — ANA W/REFLEX: Anti Nuclear Antibody (ANA): NEGATIVE

## 2022-01-13 LAB — SEDIMENTATION RATE: Sed Rate: 35 mm/hr — ABNORMAL HIGH (ref 0–20)

## 2022-01-13 LAB — RHEUMATOID FACTOR: Rheumatoid fact SerPl-aCnc: <14 IU/mL (ref <14)

## 2022-01-16 ENCOUNTER — Ambulatory Visit: Payer: 59 | Admitting: Internal Medicine

## 2022-01-29 ENCOUNTER — Encounter: Payer: Self-pay | Admitting: Internal Medicine

## 2022-02-09 ENCOUNTER — Encounter: Payer: 59 | Admitting: Internal Medicine

## 2022-03-06 ENCOUNTER — Ambulatory Visit (AMBULATORY_SURGERY_CENTER): Payer: 59 | Admitting: *Deleted

## 2022-03-06 VITALS — Ht 68.0 in | Wt 167.0 lb

## 2022-03-06 DIAGNOSIS — Z860100 Personal history of colon polyps, unspecified: Secondary | ICD-10-CM

## 2022-03-06 NOTE — Progress Notes (Signed)
No egg or soy allergy known to patient  No issues known to pt with past sedation with any surgeries or procedures Patient denies ever being told they had issues or difficulty with intubation  No FH of Malignant Hyperthermia Pt is not on diet pills Pt is not on  home 02  Pt is not on blood thinners  Pt denies issues with constipation  Pt is not on dialysis Pt denies any upcoming cardiac testing  Pt has Suprep at home already  Pt's previsit is done over the phone and all paperwork (prep instructions, blank consent form to just read over) sent to patient.  Pt's name and DOB verified at the beginning of the previsit.  Pt denies any difficulty with ambulating.   Patient's chart reviewed by Osvaldo Angst CNRA prior to previsit and patient appropriate for the [facility].  Previsit completed and red dot placed by patient's name on their procedure day (on provider's schedule).

## 2022-03-19 ENCOUNTER — Encounter: Payer: Self-pay | Admitting: Internal Medicine

## 2022-03-23 ENCOUNTER — Ambulatory Visit (AMBULATORY_SURGERY_CENTER): Payer: 59 | Admitting: Internal Medicine

## 2022-03-23 ENCOUNTER — Encounter: Payer: Self-pay | Admitting: Internal Medicine

## 2022-03-23 VITALS — BP 106/68 | HR 66 | Temp 98.9°F | Resp 14 | Ht 68.0 in | Wt 167.0 lb

## 2022-03-23 DIAGNOSIS — Z09 Encounter for follow-up examination after completed treatment for conditions other than malignant neoplasm: Secondary | ICD-10-CM

## 2022-03-23 DIAGNOSIS — Z86010 Personal history of colonic polyps: Secondary | ICD-10-CM | POA: Diagnosis not present

## 2022-03-23 MED ORDER — SODIUM CHLORIDE 0.9 % IV SOLN: 500.0000 mL | Freq: Once | INTRAVENOUS | Status: DC

## 2022-03-23 NOTE — Progress Notes (Signed)
Report to PACU, RN, vss, BBS= Clear.  

## 2022-03-23 NOTE — Op Note (Signed)
[facility] Patient Name: Matthew Walter Procedure Date: 03/23/2022 2:24 PM MRN: [id number] Endoscopist: Docia Chuck. Henrene Pastor , MD, [id number] Age: 62 Referring MD:  Date of Birth: 1960/04/09 Gender: Male Account #: [id number] Procedure:                Colonoscopy Indications:              High risk colon cancer surveillance: Personal                            history of non-advanced adenoma (2015) Medicines:                Monitored Anesthesia Care Procedure:                Pre-Anesthesia Assessment:                           - Prior to the procedure, a History and Physical                            was performed, and patient medications and                            allergies were reviewed. The patient's tolerance of                            previous anesthesia was also reviewed. The risks                            and benefits of the procedure and the sedation                            options and risks were discussed with the patient.                            All questions were answered, and informed consent                            was obtained. Prior Anticoagulants: The patient has                            taken no anticoagulant or antiplatelet agents. ASA                            Grade Assessment: II - A patient with mild systemic                            disease. After reviewing the risks and benefits,                            the patient was deemed in satisfactory condition to                            undergo the procedure.  After obtaining informed consent, the colonoscope                            was passed under direct vision. Throughout the                            procedure, the patient's blood pressure, pulse, and                            oxygen saturations were monitored continuously. The                            Olympus CF-HQ190L [id number]) Colonoscope was                            introduced through the anus and  advanced to the the                            cecum, identified by appendiceal orifice and                            ileocecal valve. The ileocecal valve, appendiceal                            orifice, and rectum were photographed. The quality                            of the bowel preparation was excellent. The                            colonoscopy was performed without difficulty. The                            patient tolerated the procedure well. The bowel                            preparation used was SUPREP via split dose                            instruction. Scope In: 2:36:26 PM Scope Out: 2:52:59 PM Scope Withdrawal Time: 0 hours 13 minutes 13 seconds  Total Procedure Duration: 0 hours 16 minutes 33 seconds  Findings:                 The entire examined colon appeared normal on direct                            and retroflexion views. Complications:            No immediate complications. Estimated blood loss:                            None. Estimated Blood Loss:     Estimated blood loss: none. Impression:               - The entire examined colon is  normal on direct and                            retroflexion views.                           - No specimens collected. Recommendation:           - Repeat colonoscopy in 10 years for surveillance.                           - Patient has a contact number available for                            emergencies. The signs and symptoms of potential                            delayed complications were discussed with the                            patient. Return to normal activities tomorrow.                            Written discharge instructions were provided to the                            patient.                           - Resume previous diet.                           - Continue present medications. Docia Chuck. Henrene Pastor, MD 03/23/2022 2:56:09 PM This report has been signed electronically.

## 2022-03-23 NOTE — Patient Instructions (Addendum)
Repeat colonoscopy in 10 years for surveillance, no polyps today. You will need another screening colonoscopy in 10 years, you will receive a letter at that time when you are due for the procedure.   Please call us at [phone] if you have a change in bowel habits, change in family history of colo-rectal cancer, rectal bleeding or other GI concern before that time. - Resume previous diet. - Continue present medications.  YOU HAD AN ENDOSCOPIC PROCEDURE TODAY AT [facility]:   Refer to the procedure report that was given to you for any specific questions about what was found during the examination.  If the procedure report does not answer your questions, please call your gastroenterologist to clarify.  If you requested that your care partner not be given the details of your procedure findings, then the procedure report has been included in a sealed envelope for you to review at your convenience later.  YOU SHOULD EXPECT: Some feelings of bloating in the abdomen. Passage of more gas than usual.  Walking can help get rid of the air that was put into your GI tract during the procedure and reduce the bloating. If you had a lower endoscopy (such as a colonoscopy or flexible sigmoidoscopy) you may notice spotting of blood in your stool or on the toilet paper. If you underwent a bowel prep for your procedure, you may not have a normal bowel movement for a few days.  Please Note:  You might notice some irritation and congestion in your nose or some drainage.  This is from the oxygen used during your procedure.  There is no need for concern and it should clear up in a day or so.  SYMPTOMS TO REPORT IMMEDIATELY:  Following lower endoscopy (colonoscopy):  Excessive amounts of blood in the stool  Significant tenderness or worsening of abdominal pains  Swelling of the abdomen that is new, acute  Fever of 100F or higher  For urgent or emergent issues, a gastroenterologist can be reached at  any hour by calling [phone]. Do not use MyChart messaging for urgent concerns.   DIET:  We do recommend a small meal at first, but then you may proceed to your regular diet.  Drink plenty of fluids but you should avoid alcoholic beverages for 24 hours.  ACTIVITY:  You should plan to take it easy for the rest of today and you should NOT DRIVE or use heavy machinery until tomorrow (because of the sedation medicines used during the test).    FOLLOW UP: Our staff will call the number listed on your records the next business day following your procedure.  We will call around 7:15- 8:00 am to check on you and address any questions or concerns that you may have regarding the information given to you following your procedure. If we do not reach you, we will leave a message.     SIGNATURES/CONFIDENTIALITY: You and/or your care partner have signed paperwork which will be entered into your electronic medical record.  These signatures attest to the fact that that the information above on your After Visit Summary has been reviewed and is understood.  Full responsibility of the confidentiality of this discharge information lies with you and/or your care-partner.

## 2022-03-23 NOTE — Progress Notes (Signed)
HISTORY OF PRESENT ILLNESS:  Matthew Walter is a 62 y.o. male who presents today for surveillance colonoscopy.  Previous exam 2015 with tubular adenoma.  No complaints  REVIEW OF SYSTEMS:  All non-GI ROS negative. Past Medical History:  Diagnosis Date   Allergy    GERD (gastroesophageal reflux disease)    Hyperlipidemia     Past Surgical History:  Procedure Laterality Date   COLONOSCOPY     ROTATOR CUFF REPAIR Bilateral 2010--R, 2012-- L   Dr Alphonzo Cruise and then Dr Theda Sers    Social History Val Riles Shearer  reports that he has never smoked. He has been exposed to tobacco smoke. He has never used smokeless tobacco. He reports current alcohol use. He reports that he does not use drugs.  family history includes Cancer in his maternal grandfather, maternal grandmother, and maternal uncle; Healthy in his mother; Heart disease in his father.  No Known Allergies     PHYSICAL EXAMINATION: Vital signs: BP (!) 140/79   Pulse 85   Temp 98.9 F (37.2 C)   Resp 17   Ht '5\' 8"'$  (1.727 m)   Wt 167 lb (75.8 kg)   SpO2 97%   BMI 25.39 kg/m  General: Well-developed, well-nourished, no acute distress HEENT: Sclerae are anicteric, conjunctiva pink. Oral mucosa intact Lungs: Clear Heart: Regular Abdomen: soft, nontender, nondistended, no obvious ascites, no peritoneal signs, normal bowel sounds. No organomegaly. Extremities: No edema Psychiatric: alert and oriented x3. Cooperative     ASSESSMENT:  History of adenomatous polyp 2015   PLAN:   Surveillance colonoscopy

## 2022-03-24 ENCOUNTER — Telehealth: Payer: Self-pay | Admitting: *Deleted

## 2022-03-24 NOTE — Telephone Encounter (Signed)
Post procedure follow up call placed, no answer and left VM.  

## 2022-04-28 ENCOUNTER — Other Ambulatory Visit: Payer: Self-pay | Admitting: Internal Medicine

## 2023-08-24 ENCOUNTER — Ambulatory Visit: Admitting: General Practice

## 2023-08-24 ENCOUNTER — Encounter: Payer: Self-pay | Admitting: General Practice

## 2023-08-24 VITALS — BP 138/88 | HR 100 | Temp 98.1°F | Ht 67.0 in | Wt 178.0 lb

## 2023-08-24 DIAGNOSIS — R053 Chronic cough: Secondary | ICD-10-CM | POA: Insufficient documentation

## 2023-08-24 MED ORDER — BENZONATATE 200 MG PO CAPS: 200.0000 mg | ORAL_CAPSULE | Freq: Three times a day (TID) | ORAL | 0 refills | Status: DC | PRN

## 2023-08-24 NOTE — Assessment & Plan Note (Signed)
 Exam stable.  Lungs clear.  Unclear etiology.  Differentials include allergic rhinitis, silent acid reflux, rebound affect secondary to Afrin.  Discussed treatment options at length with patient.  At this time, he is agreeable to start daily antihistamine, such as Claritin once a day.  Rx sent for Tessalon 200 mg TID PRN.  Referral placed for ENT.  F/u in 2 weeks with PCP.  Consider PPI if not better.

## 2023-08-24 NOTE — Progress Notes (Signed)
 Established Patient Office Visit  Subjective   Patient ID: Matthew Walter, male    DOB: 1960/08/10  Age: 63 y.o. MRN: [id number]  Chief Complaint  Patient presents with   Respiratory Issues    States he wakes up coughing and nothing comes up. Clearing throat often. Has chronic nasal congestion with postnasal drip. Had a full allergy workup recently and no allergies. [facility] requires a referral.    HPI  Matthew Walter is a 63 year old male, patient of Dr. Joelle Musca, with past medical history of reactive airway disease, OSA, allergic rhinitis, GERD, HLD, presents today for an acute visit.   Congestion: ongoing for many years. Mostly in the morning. Wakes up with coughing. Clearing throat often. Worse when laying down. He is having post nasal drip. He is also hearing a noise left nare. He has been [facility] and has had multiple allergy testing. He called an ENT office but was told that he needs a referral from PCP. He has made a lot of dietary changes but still continues to have symptoms. He denies any ear pain, shortness of breath, sore throat, fever or chills. He has used a compounded spray with Azelastine, ipratropium bromide and oxymetazoline and  triamcinolone acetonide. He is aware of the oxymetazoline and the rebound affect. His symptoms have started getting worse before he started using the spray. The spray has been effective. He has tried daily zyrtec and other antihistamine and had no relief. Years ago, he tried pepcid and does not recall it being effective. He eats home cooked meals; drinking more water. He has been avoiding fried and greasy foods.   Patient Active Problem List   Diagnosis Date Noted   Chronic cough 08/24/2023   Polyarthralgia 01/12/2022   Reactive airway disease 05/09/2019   OSA (obstructive sleep apnea) 05/09/2019   Allergic rhinitis due to pollen 11/30/2017   Otitis externa 10/14/2016   Routine general medical examination at a health care facility 09/13/2012    GERD 06/21/2008   Hyperlipemia 05/11/2008   Past Medical History:  Diagnosis Date   Allergy    GERD (gastroesophageal reflux disease)    Hyperlipidemia    Past Surgical History:  Procedure Laterality Date   COLONOSCOPY     ROTATOR CUFF REPAIR Bilateral 2010--R, 2012-- L   Dr Mauricia South and then Dr Hazeline Lister   No Known Allergies       08/24/2023    3:28 PM 12/09/2018    9:29 AM 11/30/2017   11:28 AM  Depression screen PHQ 2/9  Decreased Interest 0 0 0  Down, Depressed, Hopeless 0 0 0  PHQ - 2 Score 0 0 0        No data to display            Review of Systems  Constitutional:  Negative for chills and fever.  HENT:  Positive for congestion. Negative for ear pain and sore throat.   Respiratory:  Positive for cough. Negative for shortness of breath.   Cardiovascular:  Negative for chest pain.  Gastrointestinal:  Negative for abdominal pain, constipation, diarrhea, heartburn, nausea and vomiting.  Genitourinary:  Negative for dysuria, frequency and urgency.  Neurological:  Negative for dizziness and headaches.  Endo/Heme/Allergies:  Negative for polydipsia.  Psychiatric/Behavioral:  Negative for depression and suicidal ideas. The patient is not nervous/anxious.       Objective:     BP 138/88 (BP Location: Left Arm, Patient Position: Sitting, Cuff Size: Large)   Pulse 100  Temp 98.1 F (36.7 C) (Oral)   Ht 5\' 7"  (1.702 m)   Wt 178 lb (80.7 kg)   SpO2 95%   BMI 27.88 kg/m  BP Readings from Last 3 Encounters:  08/24/23 138/88  03/23/22 106/68  01/12/22 138/76   Wt Readings from Last 3 Encounters:  08/24/23 178 lb (80.7 kg)  03/23/22 167 lb (75.8 kg)  03/06/22 167 lb (75.8 kg)      Physical Exam Vitals and nursing note reviewed.  Constitutional:      Appearance: Normal appearance.  HENT:     Right Ear: Tympanic membrane, ear canal and external ear normal.     Left Ear: Tympanic membrane, ear canal and external ear normal.     Mouth/Throat:     Mouth:  Mucous membranes are moist.  Eyes:     Conjunctiva/sclera: Conjunctivae normal.  Cardiovascular:     Rate and Rhythm: Normal rate and regular rhythm.     Pulses: Normal pulses.     Heart sounds: Normal heart sounds.  Pulmonary:     Effort: Pulmonary effort is normal.     Breath sounds: Normal breath sounds.  Neurological:     Mental Status: He is alert and oriented to person, place, and time.  Psychiatric:        Mood and Affect: Mood normal.        Behavior: Behavior normal.        Thought Content: Thought content normal.        Judgment: Judgment normal.      No results found for any visits on 08/24/23.     The 10-year ASCVD risk score (Arnett DK, et al., 2019) is: 11.9%    Assessment & Plan:  Chronic cough Assessment & Plan: Exam stable.  Lungs clear.  Unclear etiology.  Differentials include allergic rhinitis, silent acid reflux, rebound affect secondary to Afrin.  Discussed treatment options at length with patient.  At this time, he is agreeable to start daily antihistamine, such as Claritin once a day.  Rx sent for Tessalon 200 mg TID PRN.  Referral placed for ENT.  F/u in 2 weeks with PCP.  Consider PPI if not better.  Orders: -     Benzonatate; Take 1 capsule (200 mg total) by mouth 3 (three) times daily as needed.  Dispense: 20 capsule; Refill: 0 -     Ambulatory referral to ENT     Return in about 2 weeks (around 09/07/2023) for cough.    Jolanda Nation, NP

## 2023-08-24 NOTE — Patient Instructions (Addendum)
 Start Benzonatate capsules for cough. Take 1 capsule by mouth three times daily as needed for cough.  Start Claritin once daily.   You will either be contacted via phone regarding your referral to ENT, or you may receive a letter on your MyChart portal from our referral team with instructions for scheduling an appointment. Please let us  know if you have not been contacted by anyone within two weeks.   Schedule follow up with PCP.   It was a pleasure meeting you!

## 2023-09-06 ENCOUNTER — Ambulatory Visit: Admitting: Internal Medicine

## 2023-09-06 ENCOUNTER — Encounter: Payer: Self-pay | Admitting: Internal Medicine

## 2023-09-06 VITALS — BP 138/74 | HR 90 | Temp 98.3°F | Ht 67.0 in | Wt 175.0 lb

## 2023-09-06 DIAGNOSIS — R053 Chronic cough: Secondary | ICD-10-CM | POA: Diagnosis not present

## 2023-09-06 MED ORDER — OMEPRAZOLE 20 MG PO CPDR: 20.0000 mg | DELAYED_RELEASE_CAPSULE | Freq: Every day | ORAL | 3 refills | Status: AC

## 2023-09-06 MED ORDER — ALBUTEROL SULFATE HFA 108 (90 BASE) MCG/ACT IN AERS: 2.0000 | INHALATION_SPRAY | Freq: Four times a day (QID) | RESPIRATORY_TRACT | 2 refills | Status: AC | PRN

## 2023-09-06 NOTE — Progress Notes (Signed)
 Subjective:    Patient ID: Matthew Walter, male    DOB: 1960-06-04, 63 y.o.   MRN: [id number]  HPI Here for follow up with chronic cough  Now working new hours--starts at [employer] (so needs to get up like 1:30AM)  Driving a lot---now trying to walk more on his breaks  Ongoing cough Thought it was allergies [facility] (and had extensive blood work there) Was sensitive to black beans and [place]  Can feel the cough in his sinuses The cough is mostly at night--but now sleeping on a wedge (some help) Antihistamines not really helpful Now having it affect his sleep Does eat within 2-3 hours of having to go to sleep (since goes to sleep so early) Hears noise in head (?sinuses) when he blows his nose  Current Outpatient Medications on File Prior to Visit  Medication Sig Dispense Refill   sildenafil  (REVATIO ) 20 MG tablet Take 3-5 tablets (60-100 mg total) by mouth daily as needed. 50 tablet 11   No current facility-administered medications on file prior to visit.    No Known Allergies  Past Medical History:  Diagnosis Date   Allergy    GERD (gastroesophageal reflux disease)    Hyperlipidemia     Past Surgical History:  Procedure Laterality Date   COLONOSCOPY     ROTATOR CUFF REPAIR Bilateral 2010--R, 2012-- L   Dr Van and then Dr Gerome    Family History  Problem Relation Age of Onset   Healthy Mother    Heart disease Father    Cancer Maternal Uncle    Cancer Maternal Grandmother    Cancer Maternal Grandfather    Hypertension Neg Hx    Diabetes Neg Hx    Colon cancer Neg Hx    Rectal cancer Neg Hx    Stomach cancer Neg Hx     Social History   Socioeconomic History   Marital status: Married    Spouse name: Not on file   Number of children: 4   Years of education: Not on file   Highest education level: Not on file  Occupational History   Occupation: Drives for [employer]  Tobacco Use   Smoking status: Never     Passive exposure: Past   Smokeless tobacco: Never  Vaping Use   Vaping status: Never Used  Substance and Sexual Activity   Alcohol use: Yes   Drug use: No   Sexual activity: Not on file  Other Topics Concern   Not on file  Social History Narrative   Not on file   Social Drivers of Health   Financial Resource Strain: Patient Declined (09/02/2023)   Overall Financial Resource Strain (CARDIA)    Difficulty of Paying Living Expenses: Patient declined  Food Insecurity: Patient Declined (09/02/2023)   Hunger Vital Sign    Worried About Running Out of Food in the Last Year: Patient declined    Ran Out of Food in the Last Year: Patient declined  Transportation Needs: Patient Declined (09/02/2023)   PRAPARE - Transportation    Lack of Transportation (Medical): Patient declined    Lack of Transportation (Non-Medical): Patient declined  Physical Activity: Sufficiently Active (09/02/2023)   Exercise Vital Sign    Days of Exercise per Week: 6 days    Minutes of Exercise per Session: 40 min  Stress: No Stress Concern Present (09/02/2023)   [employer] of Occupational Health - Occupational Stress Questionnaire    Feeling of Stress: Not at all  Social Connections: Unknown (09/02/2023)   Social Connection and Isolation Panel    Frequency of Communication with Friends and Family: Patient declined    Frequency of Social Gatherings with Friends and Family: Patient declined    Attends Religious Services: Patient declined    Database administrator or Organizations: Patient declined    Attends Engineer, structural: Not on file    Marital Status: Married  Catering manager Violence: Not on file   Review of Systems No SOB--other than with cough spells No wheezing or history of asthma No ear pain No headaches--other than sinus (behind eyes at times)     Objective:   Physical Exam Constitutional:      Appearance: Normal appearance.  HENT:     Right Ear: Tympanic membrane and ear  canal normal.     Left Ear: Tympanic membrane and ear canal normal.     Nose: No congestion or rhinorrhea.     Mouth/Throat:     Pharynx: No oropharyngeal exudate or posterior oropharyngeal erythema.  Pulmonary:     Effort: Pulmonary effort is normal.     Breath sounds: No wheezing or rales.     Comments: Not tight but has some squeaks (I/E)  Musculoskeletal:     Cervical back: Neck supple.  Lymphadenopathy:     Cervical: No cervical adenopathy.   Neurological:     Mental Status: He is alert.            Assessment & Plan:

## 2023-09-06 NOTE — Assessment & Plan Note (Signed)
 Ongoing Will add omeprazole at bedtime---as well as albuterol , just in case Refer to Dr Darlean for evaluation Has ENT in August Continue zyrtec or other allergy med, elevation of HOB, etc

## 2023-09-20 ENCOUNTER — Encounter: Payer: Self-pay | Admitting: Internal Medicine

## 2023-09-20 MED ORDER — FLUTICASONE-SALMETEROL 100-50 MCG/ACT IN AEPB: 1.0000 | INHALATION_SPRAY | Freq: Two times a day (BID) | RESPIRATORY_TRACT | 1 refills | Status: DC

## 2023-10-11 ENCOUNTER — Encounter: Payer: Self-pay | Admitting: Pulmonary Disease

## 2023-10-11 ENCOUNTER — Ambulatory Visit (INDEPENDENT_AMBULATORY_CARE_PROVIDER_SITE_OTHER)

## 2023-10-11 ENCOUNTER — Ambulatory Visit (INDEPENDENT_AMBULATORY_CARE_PROVIDER_SITE_OTHER): Admitting: Pulmonary Disease

## 2023-10-11 VITALS — BP 164/95 | HR 81 | Ht 67.0 in | Wt 176.6 lb

## 2023-10-11 DIAGNOSIS — R053 Chronic cough: Secondary | ICD-10-CM

## 2023-10-11 DIAGNOSIS — J4520 Mild intermittent asthma, uncomplicated: Secondary | ICD-10-CM

## 2023-10-11 DIAGNOSIS — R0982 Postnasal drip: Secondary | ICD-10-CM | POA: Diagnosis not present

## 2023-10-11 MED ORDER — FLUTICASONE-SALMETEROL 100-50 MCG/ACT IN AEPB: 1.0000 | INHALATION_SPRAY | Freq: Two times a day (BID) | RESPIRATORY_TRACT | 4 refills | Status: DC

## 2023-10-11 NOTE — Progress Notes (Signed)
 Subjective:   PATIENT ID: Matthew Walter GENDER: male DOB: 04/09/1960, MRN: [id number]   HPI  Chief Complaint  Patient presents with   Consult    Pt states he is experiencing a mostly dry cough for roughly a year. He occasionally produces green phlegm.      Discussed the use of AI scribe software for clinical note transcription with the patient, who gave verbal consent to proceed.  Matthew Walter is a 63 year old male who presents with a chronic cough and sinus congestion.  He has experienced a persistent cough for seven to eight months, characterized by random, hard coughing fits during the day with occasional green or clear mucus production. The cough worsens at night, disrupting sleep. Sinus congestion and postnasal drip are present.  He has tried omeprazole  without relief. Albuterol  provided some relief but did not improve sleep. Advair significantly improved sleep quality after five to six days of use. A compounded nasal spray has been beneficial which includes azelastine, ipratropium, oxymetazoline and triamcinolone. A nasal rinse device initially helped but is now less effective.  He works as a Naval architect with a demanding schedule, starting at 3 AM and working four ten-hour days, impacting his sleep. He has seasonal allergies but no significant environmental or food allergies. No history of smoking, secondhand smoke exposure, or occupational exposure to dust or chemicals. No recent illnesses or viral infections. Occasional headaches and sinus pressure are present. No history of sinus surgeries or recent chest x-ray.  Past Medical History:  Diagnosis Date   Allergy    GERD (gastroesophageal reflux disease)    Hyperlipidemia      Family History  Problem Relation Age of Onset   Healthy Mother    Heart disease Father    Cancer Maternal Uncle    Cancer Maternal Grandmother    Cancer Maternal Grandfather    Hypertension Neg Hx    Diabetes Neg Hx    Colon cancer Neg Hx     Rectal cancer Neg Hx    Stomach cancer Neg Hx      Social History   Socioeconomic History   Marital status: Married    Spouse name: Not on file   Number of children: 4   Years of education: Not on file   Highest education level: Not on file  Occupational History   Occupation: Drives for [employer]  Tobacco Use   Smoking status: Never    Passive exposure: Past   Smokeless tobacco: Never  Vaping Use   Vaping status: Never Used  Substance and Sexual Activity   Alcohol use: Yes   Drug use: No   Sexual activity: Not on file  Other Topics Concern   Not on file  Social History Narrative   Not on file   Social Drivers of Health   Financial Resource Strain: Patient Declined (09/02/2023)   Overall Financial Resource Strain (CARDIA)    Difficulty of Paying Living Expenses: Patient declined  Food Insecurity: Patient Declined (09/02/2023)   Hunger Vital Sign    Worried About Running Out of Food in the Last Year: Patient declined    Ran Out of Food in the Last Year: Patient declined  Transportation Needs: Patient Declined (09/02/2023)   PRAPARE - Transportation    Lack of Transportation (Medical): Patient declined    Lack of Transportation (Non-Medical): Patient declined  Physical Activity: Sufficiently Active (09/02/2023)   Exercise Vital Sign    Days of Exercise per Week: 6 days  Minutes of Exercise per Session: 40 min  Stress: No Stress Concern Present (09/02/2023)   [employer] of Occupational Health - Occupational Stress Questionnaire    Feeling of Stress: Not at all  Social Connections: Unknown (09/02/2023)   Social Connection and Isolation Panel    Frequency of Communication with Friends and Family: Patient declined    Frequency of Social Gatherings with Friends and Family: Patient declined    Attends Religious Services: Patient declined    Database administrator or Organizations: Patient declined    Attends Engineer, structural: Not on file    Marital Status:  Married  Catering manager Violence: Not on file     No Known Allergies   Outpatient Medications Prior to Visit  Medication Sig Dispense Refill   albuterol  (VENTOLIN  HFA) 108 (90 Base) MCG/ACT inhaler Inhale 2 puffs into the lungs every 6 (six) hours as needed for wheezing or shortness of breath. 8 g 2   sildenafil  (REVATIO ) 20 MG tablet Take 3-5 tablets (60-100 mg total) by mouth daily as needed. 50 tablet 11   fluticasone -salmeterol (ADVAIR) 100-50 MCG/ACT AEPB Inhale 1 puff into the lungs 2 (two) times daily. 60 each 1   omeprazole  (PRILOSEC) 20 MG capsule Take 1 capsule (20 mg total) by mouth at bedtime. (Patient not taking: Reported on 10/11/2023) 30 capsule 3   No facility-administered medications prior to visit.    Review of Systems  Constitutional:  Negative for chills, fever, malaise/fatigue and weight loss.  HENT:  Positive for congestion. Negative for sinus pain and sore throat.   Eyes: Negative.   Respiratory:  Positive for cough, sputum production and wheezing. Negative for hemoptysis and shortness of breath.   Cardiovascular:  Negative for chest pain, palpitations, orthopnea, claudication and leg swelling.  Gastrointestinal:  Negative for abdominal pain, heartburn, nausea and vomiting.  Genitourinary: Negative.   Musculoskeletal:  Negative for joint pain and myalgias.  Skin:  Negative for rash.  Neurological:  Negative for weakness.  Endo/Heme/Allergies: Negative.   Psychiatric/Behavioral: Negative.      Objective:   Vitals:   10/11/23 0900  BP: (!) 164/95  Pulse: 81  SpO2: 99%  Weight: 176 lb 9.6 oz (80.1 kg)  Height: 5' 7 (1.702 m)   Physical Exam Constitutional:      General: He is not in acute distress.    Appearance: Normal appearance.  Eyes:     General: No scleral icterus.    Conjunctiva/sclera: Conjunctivae normal.  Cardiovascular:     Rate and Rhythm: Normal rate and regular rhythm.  Pulmonary:     Breath sounds: No wheezing, rhonchi or rales.   Musculoskeletal:     Right lower leg: No edema.     Left lower leg: No edema.  Skin:    General: Skin is warm and dry.  Neurological:     General: No focal deficit present.    CBC    Component Value Date/Time   WBC 8.1 11/25/2021 0831   RBC 5.66 11/25/2021 0831   HGB 16.8 11/25/2021 0831   HCT 50.1 11/25/2021 0831   PLT 274.0 11/25/2021 0831   MCV 88.6 11/25/2021 0831   MCH 28.9 04/12/2019 1313   MCHC 33.4 11/25/2021 0831   RDW 14.1 11/25/2021 0831   LYMPHSABS 2.9 04/12/2019 1313   MONOABS 0.8 04/12/2019 1313   EOSABS 0.7 (H) 04/12/2019 1313   BASOSABS 0.1 04/12/2019 1313      Latest Ref Rng & Units 11/25/2021    8:31 AM  12/09/2018   10:26 AM 11/30/2017   12:04 PM  BMP  Glucose 70 - 99 mg/dL 93  81  79   BUN 6 - 23 mg/dL 15  17  15    Creatinine 0.40 - 1.50 mg/dL 9.01  9.05  9.05   Sodium 135 - 145 mEq/L 137  138  140   Potassium 3.5 - 5.1 mEq/L 4.1  4.0  4.1   Chloride 96 - 112 mEq/L 100  101  102   CO2 19 - 32 mEq/L 28  28  26    Calcium 8.4 - 10.5 mg/dL 9.6  89.8  9.8    Chest imaging: CXR 10/11/23 - personal read No pneumothorax. No pleural effusions. No infiltrates or opacities. Overall normal chest x-ray.  PFT:     No data to display          Labs:  Path:  Echo:  Heart Catheterization:  Home Sleep Study 04/20/19 Mild OSA      Assessment & Plan:   Mild intermittent reactive airway disease without complication - Plan: fluticasone -salmeterol (ADVAIR) 100-50 MCG/ACT AEPB  Chronic cough - Plan: DG Chest 2 View Assessment and Plan     Chronic cough with postnasal drip Chronic cough likely due to postnasal drip and possible silent reflux. Improved with nasal spray and Advair inhaler. Differential includes sinus allergies, silent reflux, reactive airways disease. - Continue nasal spray and Advair inhaler. - Chest x-ray reviewed, appears normal will follow up radiology report. - Follow up with ENT for sinus evaluation. - Consider pulmonary function  tests if symptoms persist or worsen. - Evaluate inhaler response after three months; consider tapering medications   Follow up in 3 months  Dorn Chill, MD [facility] Office: [phone]    Current Outpatient Medications:    albuterol  (VENTOLIN  HFA) 108 (90 Base) MCG/ACT inhaler, Inhale 2 puffs into the lungs every 6 (six) hours as needed for wheezing or shortness of breath., Disp: 8 g, Rfl: 2   sildenafil  (REVATIO ) 20 MG tablet, Take 3-5 tablets (60-100 mg total) by mouth daily as needed., Disp: 50 tablet, Rfl: 11   fluticasone -salmeterol (ADVAIR) 100-50 MCG/ACT AEPB, Inhale 1 puff into the lungs 2 (two) times daily., Disp: 60 each, Rfl: 4   omeprazole  (PRILOSEC) 20 MG capsule, Take 1 capsule (20 mg total) by mouth at bedtime. (Patient not taking: Reported on 10/11/2023), Disp: 30 capsule, Rfl: 3

## 2023-10-11 NOTE — Patient Instructions (Addendum)
 Continue advair 100-50mcg 1 puff twice daily - rinse mouth out after each use  Continue the compound nasal spray  We will check a chest x-ray today  We will consider pulmonary function tests in the future  We will check a chest x-ray today  Follow up in 3 months

## 2023-10-17 ENCOUNTER — Ambulatory Visit: Payer: Self-pay | Admitting: Pulmonary Disease

## 2023-11-08 ENCOUNTER — Ambulatory Visit (INDEPENDENT_AMBULATORY_CARE_PROVIDER_SITE_OTHER): Admitting: Otolaryngology

## 2023-11-08 ENCOUNTER — Encounter (INDEPENDENT_AMBULATORY_CARE_PROVIDER_SITE_OTHER): Payer: Self-pay | Admitting: Otolaryngology

## 2023-11-08 VITALS — BP 159/89 | HR 97 | Ht 67.0 in | Wt 172.0 lb

## 2023-11-08 DIAGNOSIS — R058 Other specified cough: Secondary | ICD-10-CM

## 2023-11-08 DIAGNOSIS — K219 Gastro-esophageal reflux disease without esophagitis: Secondary | ICD-10-CM | POA: Diagnosis not present

## 2023-11-08 DIAGNOSIS — R053 Chronic cough: Secondary | ICD-10-CM

## 2023-11-08 DIAGNOSIS — J45909 Unspecified asthma, uncomplicated: Secondary | ICD-10-CM

## 2023-11-08 DIAGNOSIS — R0982 Postnasal drip: Secondary | ICD-10-CM

## 2023-11-08 DIAGNOSIS — R0989 Other specified symptoms and signs involving the circulatory and respiratory systems: Secondary | ICD-10-CM

## 2023-11-08 MED ORDER — FLUTICASONE PROPIONATE 50 MCG/ACT NA SUSP: 2.0000 | Freq: Every day | NASAL | 6 refills | Status: AC

## 2023-11-08 MED ORDER — AZELASTINE HCL 0.1 % NA SOLN: 2.0000 | Freq: Every day | NASAL | 12 refills | Status: AC

## 2023-11-08 MED ORDER — IPRATROPIUM BROMIDE 0.03 % NA SOLN: 2.0000 | Freq: Two times a day (BID) | NASAL | 12 refills | Status: AC

## 2023-11-08 NOTE — Progress Notes (Signed)
 Dear Dr. Vincente, Here is my assessment for our mutual patient, Matthew Walter. Thank you for allowing me the opportunity to care for your patient. Please do not hesitate to contact me should you have any other questions. Sincerely, Dr. Eldora Blanch  Otolaryngology Clinic Note Referring provider: Dr. Vincente HPI:  Matthew Walter is a 63 y.o. male kindly referred by Dr. Vincente for evaluation of chronic cough  Initial visit (10/2023): Reports that for the past few years, he has had some sinus issues and a cough; especially over the last 6 months to a year, he has a dry cough which started at night and then now during the day as well. Non-productive. Occurs anywhere, so no specific environment. He reports that he has coughing fits after he can feel something in the back of the throat. He does report a tickle, with associated symptoms include throat clearing (constantly). Voice is without issue. Swallowing without issue, no odynophagia.  He has been on nasal sprays (compounded ipratropium, astelin , flonase  and micro amount of afrin) and advair. Both of these have helped significantly and now his coughing is significantly better as well as congestion. Throat clearing continues. He has also been treated for silent reflux without significant benefit.   Some nasal congestion, no frequent sinus infections, no facial pain/pressure, will have occasional A/P rhinorrhea (mucoid)  Drinks 64 oz of water  ENT Surgery: no Personal or FHx of bleeding dz or anesthesia difficulty: no  GLP-1: no AP/AC: no  Tobacco: no  PMHx: Erythrocytosis, OSA, Reactive Airway Disease  Independent Review of Additional Tests or Records:  Dr. Jimmy (09/06/2023): noted ongoing cough, thought it was allergies but negative allergy testing; can feel the cough in nose, mostly at night; affecting his sleep; Dx: Chronic cough; Rx: add omeprazole , ref to Dr. Darlean and ENT Dr. Pearley (10/11/2023): noted chronic cough and sinus congestion, ongoing  for 7-8 months with random coughing fits during day. Noted PND and sinus congestion. Tried PPI, albuterol . Advair helped. Dymista has helped and prior rinses have helped. Dx: Mild RAD; Rx: Advair; likely due to PND and LPR, f/u with ENT Home sleep test 04/20/2019: AHI 10.2 Labs CBC 11/25/2021: WBC 8.1; Eos 700 (2021) RAST 09/19/2019: negative, IgE 66 CXR 10/11/2023 independently interpreted: no consolidation, no effusions noted PMH/Meds/All/SocHx/FamHx/ROS:   Past Medical History:  Diagnosis Date   Allergy    GERD (gastroesophageal reflux disease)    Hyperlipidemia      Past Surgical History:  Procedure Laterality Date   COLONOSCOPY     ROTATOR CUFF REPAIR Bilateral 2010--R, 2012-- L   Dr Van and then Dr Gerome    Family History  Problem Relation Age of Onset   Healthy Mother    Heart disease Father    Cancer Maternal Uncle    Cancer Maternal Grandmother    Cancer Maternal Grandfather    Hypertension Neg Hx    Diabetes Neg Hx    Colon cancer Neg Hx    Rectal cancer Neg Hx    Stomach cancer Neg Hx      Social Connections: Unknown (09/02/2023)   Social Connection and Isolation Panel    Frequency of Communication with Friends and Family: Patient declined    Frequency of Social Gatherings with Friends and Family: Patient declined    Attends Religious Services: Patient declined    Database administrator or Organizations: Patient declined    Attends Banker Meetings: Not on file    Marital Status: Married  Current Outpatient Medications:    albuterol  (VENTOLIN  HFA) 108 (90 Base) MCG/ACT inhaler, Inhale 2 puffs into the lungs every 6 (six) hours as needed for wheezing or shortness of breath., Disp: 8 g, Rfl: 2   azelastine  (ASTELIN ) 0.1 % nasal spray, Place 2 sprays into both nostrils daily. Use in each nostril as directed, Disp: 30 mL, Rfl: 12   fluticasone  (FLONASE ) 50 MCG/ACT nasal spray, Place 2 sprays into both nostrils daily., Disp: 16 g, Rfl: 6    fluticasone -salmeterol (ADVAIR) 100-50 MCG/ACT AEPB, Inhale 1 puff into the lungs 2 (two) times daily., Disp: 60 each, Rfl: 4   ipratropium (ATROVENT ) 0.03 % nasal spray, Place 2 sprays into both nostrils every 12 (twelve) hours., Disp: 30 mL, Rfl: 12   sildenafil  (REVATIO ) 20 MG tablet, Take 3-5 tablets (60-100 mg total) by mouth daily as needed., Disp: 50 tablet, Rfl: 11   omeprazole  (PRILOSEC) 20 MG capsule, Take 1 capsule (20 mg total) by mouth at bedtime. (Patient not taking: Reported on 11/08/2023), Disp: 30 capsule, Rfl: 3   Physical Exam:   BP (!) 159/89 (BP Location: Right Arm, Patient Position: Sitting, Cuff Size: Normal)   Pulse 97   Ht 5' 7 (1.702 m)   Wt 172 lb (78 kg)   SpO2 95%   BMI 26.94 kg/m   Salient findings:  CN II-XII intact Bilateral EAC clear and TM intact with well pneumatized middle ear spaces Anterior rhinoscopy: Septum relatively midline; bilateral inferior turbinates without significant hypertrophy No lesions of oral cavity/oropharynx; dentition fair No obviously palpable neck masses/lymphadenopathy/thyromegaly No respiratory distress or stridor; voice quality class 1.5; modest throat clearing; TFL was indicated to better evaluate the proximal airway, given the patient's history and exam findings, and is detailed below.  Seprately Identifiable Procedures:  Prior to initiating any procedures, risks/benefits/alternatives were explained to the patient and verbal consent obtained. Procedure Note Pre-procedure diagnosis: Post nasal drip, chronic cough and throat clearing Post-procedure diagnosis: Same Procedure: Transnasal Fiberoptic Laryngoscopy, CPT 31575 - Mod 25 Indication: see above Complications: None apparent EBL: 0 mL  The procedure was undertaken to further evaluate the patient's complaint above, with mirror exam inadequate for appropriate examination due to gag reflex and poor patient tolerance  Procedure:  Patient was identified as correct  patient. Verbal consent was obtained. The nose was sprayed with oxymetazoline and 4% lidocaine. The The flexible laryngoscope was passed through the nose to view the nasal cavity, pharynx (oropharynx, hypopharynx) and larynx.  The larynx was examined at rest and during multiple phonatory tasks. Documentation was obtained and reviewed with patient. The scope was removed. The patient tolerated the procedure well.  Findings: The nasal cavity and nasopharynx did not reveal any masses or lesions, mucosa appeared to be without obvious lesions. The tongue base, pharyngeal walls, piriform sinuses, vallecula, epiglottis and postcricoid region are normal in appearance without significant retained secretions and with mild redundant postcricoid mucosa. The visualized portion of the subglottis and proximal trachea is widely patent. The vocal folds are mobile bilaterally. There are no lesions on the free edge of the vocal folds nor elsewhere in the larynx worrisome for malignancy.    Electronically signed by: Eldora KATHEE Blanch, MD 11/08/2023 3:41 PM   Impression & Plans:  Matthew Walter is a 63 y.o. male with:  1. Chronic cough   2. Post-nasal drip   3. Chronic throat clearing   4. Upper airway cough syndrome   5. Mild reactive airways disease, unspecified whether persistent   6. Laryngopharyngeal  reflux (LPR)    Doing much better on advair so wonder if there is a reactive component to this. Nasal sprays have also helped a lot but I will prescribe him flonase , astelin  and atrovent  and recommended discontinuing afrin. We also discussed UACS and throat clearing strategies. Given improvement, do not think we need to do gabapentin/speech Rx and he agrees. For LPR, will start with barrier agent.  Given improvement, will f/u PRN; if things worsen, advised to reach back out  See below regarding exact medications prescribed this encounter including dosages and route: Meds ordered this encounter  Medications    ipratropium (ATROVENT ) 0.03 % nasal spray    Sig: Place 2 sprays into both nostrils every 12 (twelve) hours.    Dispense:  30 mL    Refill:  12   fluticasone  (FLONASE ) 50 MCG/ACT nasal spray    Sig: Place 2 sprays into both nostrils daily.    Dispense:  16 g    Refill:  6   azelastine  (ASTELIN ) 0.1 % nasal spray    Sig: Place 2 sprays into both nostrils daily. Use in each nostril as directed    Dispense:  30 mL    Refill:  12      Thank you for allowing me the opportunity to care for your patient. Please do not hesitate to contact me should you have any other questions.  Sincerely, Eldora Blanch, MD Otolaryngologist (ENT), [facility] Phone: [phone] Fax: [phone]  11/08/2023, 3:41 PM   MDM:  Level 4 - [phone] Complexity/Problems addressed: mod - chronic problems Data complexity: high - independent review of notes, labs; independent interpretation of imaging - Morbidity: mod  - Prescription Drug prescribed or managed: y

## 2023-11-08 NOTE — Patient Instructions (Addendum)
 Avoid using oxymetazoline in spray if you can Keep a candy handy Swallow or drink when you feel like you need to clear the throat Use flonase , astelin  and atrovent  (each spray two sprays each nostrils) daily or nightly.  Can use reflux gourmet after meals

## 2023-12-20 ENCOUNTER — Ambulatory Visit: Admitting: Pulmonary Disease

## 2023-12-20 ENCOUNTER — Encounter: Payer: Self-pay | Admitting: Pulmonary Disease

## 2023-12-20 VITALS — BP 153/81 | HR 91 | Ht 67.0 in | Wt 186.4 lb

## 2023-12-20 DIAGNOSIS — J4520 Mild intermittent asthma, uncomplicated: Secondary | ICD-10-CM | POA: Diagnosis not present

## 2023-12-20 DIAGNOSIS — R053 Chronic cough: Secondary | ICD-10-CM

## 2023-12-20 DIAGNOSIS — R0989 Other specified symptoms and signs involving the circulatory and respiratory systems: Secondary | ICD-10-CM | POA: Diagnosis not present

## 2023-12-20 MED ORDER — FLUTICASONE-SALMETEROL 100-50 MCG/ACT IN AEPB
1.0000 | INHALATION_SPRAY | Freq: Two times a day (BID) | RESPIRATORY_TRACT | 6 refills | Status: AC
Start: 2023-12-20 — End: ?

## 2023-12-20 NOTE — Patient Instructions (Addendum)
 Continue advair 100-50mcg 1 puff twice daily as needed - rinse mouth out after each use  You can determine a plan of using the advair inhaler based on your symptom patter. Can go to every other night.   Continue nasal sprays per the ENT team  Follow up in 1 year, call sooner if needed

## 2023-12-20 NOTE — Progress Notes (Signed)
 Subjective:   PATIENT ID: Matthew Walter GENDER: male DOB: 09-Jun-1960, MRN: [id number]   HPI  Chief Complaint  Patient presents with   Medical Management of Chronic Issues   Discussed the use of AI scribe software for clinical note transcription with the patient, who gave verbal consent to proceed.  Matthew Walter is a 63 year old male who returns for follow up of reactive airways disease.  Initial OV 10/11/23 He has experienced a persistent cough for seven to eight months, characterized by random, hard coughing fits during the day with occasional green or clear mucus production. The cough worsens at night, disrupting sleep. Sinus congestion and postnasal drip are present.  He has tried omeprazole  without relief. Albuterol  provided some relief but did not improve sleep. Advair significantly improved sleep quality after five to six days of use. A compounded nasal spray has been beneficial which includes azelastine , ipratropium, oxymetazoline and triamcinolone. A nasal rinse device initially helped but is now less effective.  He works as a Naval architect with a demanding schedule, starting at 3 AM and working four ten-hour days, impacting his sleep. He has seasonal allergies but no significant environmental or food allergies. No history of smoking, secondhand smoke exposure, or occupational exposure to dust or chemicals. No recent illnesses or viral infections. Occasional headaches and sinus pressure are present. No history of sinus surgeries or recent chest x-ray.   OV 12/20/23 He experiences a chronic cough that initially caused sleep disturbances, but these have improved with the use of an inhaler. He reduced the inhaler dose to once daily due to jitteriness, which alleviated his symptoms. He no longer wakes up coughing at night, and his cough is now less frequent and productive.  An ENT evaluation showed no sinus polyps or significant issues. He is using fluticasone , astelin  and ipratropium  nasasl sprays and has replaced a previous medication containing Afrin. Reflux Gourmet at night has reduced the sensation of throat discomfort upon waking.  He has no current wheezing or chest tightness. He is physically active, walking at least two miles daily and performing activities like leaf blowing without shortness of breath or increased cough. He has a history of throat clearing, but no visible cause was found during the ENT evaluation.  Past Medical History:  Diagnosis Date   Allergy    GERD (gastroesophageal reflux disease)    Hyperlipidemia      Family History  Problem Relation Age of Onset   Healthy Mother    Heart disease Father    Cancer Maternal Uncle    Cancer Maternal Grandmother    Cancer Maternal Grandfather    Hypertension Neg Hx    Diabetes Neg Hx    Colon cancer Neg Hx    Rectal cancer Neg Hx    Stomach cancer Neg Hx      Social History   Socioeconomic History   Marital status: Married    Spouse name: Not on file   Number of children: 4   Years of education: Not on file   Highest education level: Not on file  Occupational History   Occupation: Drives for [employer]  Tobacco Use   Smoking status: Never    Passive exposure: Past   Smokeless tobacco: Never  Vaping Use   Vaping status: Never Used  Substance and Sexual Activity   Alcohol use: Yes   Drug use: No   Sexual activity: Not on file  Other Topics Concern   Not on file  Social History Narrative   Not on file   Social Drivers of Health   Financial Resource Strain: Patient Declined (09/02/2023)   Overall Financial Resource Strain (CARDIA)    Difficulty of Paying Living Expenses: Patient declined  Food Insecurity: Patient Declined (09/02/2023)   Hunger Vital Sign    Worried About Running Out of Food in the Last Year: Patient declined    Ran Out of Food in the Last Year: Patient declined  Transportation Needs: Patient Declined (09/02/2023)   PRAPARE - Administrator, Civil Service  (Medical): Patient declined    Lack of Transportation (Non-Medical): Patient declined  Physical Activity: Sufficiently Active (09/02/2023)   Exercise Vital Sign    Days of Exercise per Week: 6 days    Minutes of Exercise per Session: 40 min  Stress: No Stress Concern Present (09/02/2023)   [employer] of Occupational Health - Occupational Stress Questionnaire    Feeling of Stress: Not at all  Social Connections: Unknown (09/02/2023)   Social Connection and Isolation Panel    Frequency of Communication with Friends and Family: Patient declined    Frequency of Social Gatherings with Friends and Family: Patient declined    Attends Religious Services: Patient declined    Database administrator or Organizations: Patient declined    Attends Engineer, structural: Not on file    Marital Status: Married  Catering manager Violence: Not on file     No Known Allergies   Outpatient Medications Prior to Visit  Medication Sig Dispense Refill   albuterol  (VENTOLIN  HFA) 108 (90 Base) MCG/ACT inhaler Inhale 2 puffs into the lungs every 6 (six) hours as needed for wheezing or shortness of breath. 8 g 2   azelastine  (ASTELIN ) 0.1 % nasal spray Place 2 sprays into both nostrils daily. Use in each nostril as directed 30 mL 12   fluticasone  (FLONASE ) 50 MCG/ACT nasal spray Place 2 sprays into both nostrils daily. 16 g 6   ipratropium (ATROVENT ) 0.03 % nasal spray Place 2 sprays into both nostrils every 12 (twelve) hours. 30 mL 12   omeprazole  (PRILOSEC) 20 MG capsule Take 1 capsule (20 mg total) by mouth at bedtime. 30 capsule 3   sildenafil  (REVATIO ) 20 MG tablet Take 3-5 tablets (60-100 mg total) by mouth daily as needed. 50 tablet 11   fluticasone -salmeterol (ADVAIR) 100-50 MCG/ACT AEPB Inhale 1 puff into the lungs 2 (two) times daily. 60 each 4   No facility-administered medications prior to visit.   Review of Systems  Constitutional:  Negative for chills, fever, malaise/fatigue and  weight loss.  HENT:  Negative for congestion, sinus pain and sore throat.   Eyes: Negative.   Respiratory:  Positive for sputum production. Negative for cough, hemoptysis, shortness of breath and wheezing.   Cardiovascular:  Negative for chest pain, palpitations, orthopnea, claudication and leg swelling.  Gastrointestinal:  Negative for abdominal pain, heartburn, nausea and vomiting.  Genitourinary: Negative.   Musculoskeletal:  Negative for joint pain and myalgias.  Skin:  Negative for rash.  Neurological:  Negative for weakness.  Endo/Heme/Allergies: Negative.   Psychiatric/Behavioral: Negative.     Objective:   Vitals:   12/20/23 0838  BP: (!) 153/81  Pulse: 91  SpO2: 97%  Weight: 186 lb 6.4 oz (84.6 kg)  Height: 5' 7 (1.702 m)    Physical Exam Constitutional:      General: He is not in acute distress.    Appearance: Normal appearance.  Eyes:  General: No scleral icterus.    Conjunctiva/sclera: Conjunctivae normal.  Cardiovascular:     Rate and Rhythm: Normal rate and regular rhythm.  Pulmonary:     Breath sounds: No wheezing, rhonchi or rales.     Comments: Scattered inspiratory chirps, left lower lung field and right anterior. Lessened with repeat deep respirations. Musculoskeletal:     Right lower leg: No edema.     Left lower leg: No edema.  Skin:    General: Skin is warm and dry.  Neurological:     General: No focal deficit present.    CBC    Component Value Date/Time   WBC 8.1 11/25/2021 0831   RBC 5.66 11/25/2021 0831   HGB 16.8 11/25/2021 0831   HCT 50.1 11/25/2021 0831   PLT 274.0 11/25/2021 0831   MCV 88.6 11/25/2021 0831   MCH 28.9 04/12/2019 1313   MCHC 33.4 11/25/2021 0831   RDW 14.1 11/25/2021 0831   LYMPHSABS 2.9 04/12/2019 1313   MONOABS 0.8 04/12/2019 1313   EOSABS 0.7 (H) 04/12/2019 1313   BASOSABS 0.1 04/12/2019 1313      Latest Ref Rng & Units 11/25/2021    8:31 AM 12/09/2018   10:26 AM 11/30/2017   12:04 PM  BMP  Glucose 70  - 99 mg/dL 93  81  79   BUN 6 - 23 mg/dL 15  17  15    Creatinine 0.40 - 1.50 mg/dL 9.01  9.05  9.05   Sodium 135 - 145 mEq/L 137  138  140   Potassium 3.5 - 5.1 mEq/L 4.1  4.0  4.1   Chloride 96 - 112 mEq/L 100  101  102   CO2 19 - 32 mEq/L 28  28  26    Calcium 8.4 - 10.5 mg/dL 9.6  89.8  9.8    Chest imaging: CXR 10/11/23 - personal read No pneumothorax. No pleural effusions. No infiltrates or opacities. Overall normal chest x-ray.  PFT:     No data to display          Labs:  Path:  Echo:  Heart Catheterization:  Home Sleep Study 04/20/19 Mild OSA      Assessment & Plan:   Mild intermittent reactive airway disease without complication - Plan: fluticasone -salmeterol (ADVAIR) 100-50 MCG/ACT AEPB  Chronic cough  Chronic throat clearing Assessment and Plan    Chronic cough with postnasal drainage and allergic rhinitis Chronic cough improved with nasal sprays and inhaler. Inspiratory chirps suggest small airway inflammation or mucus. Discussed inhaler use adjustment based on symptoms and seasons. - Continue nasal spray regimen. - Continue inhaler once daily at night for reactive airways disease - Trial reducing inhaler to every other night for one month and assess symptoms. - If symptoms worsen, return to once daily inhaler use. - Consider pulmonary function tests if symptoms suggest significant airway reactivity. - Refill inhaler prescription  Possible laryngopharyngeal reflux Reflux Gourmet reduced throat clearing and discomfort. Prefers natural remedies. - Continue Reflux Gourmet nightly per ENT - Monitor symptoms and consider further evaluation if symptoms persist.  Follow up in 1 year  Matthew Chill, MD [facility] Office: [phone]    Current Outpatient Medications:    albuterol  (VENTOLIN  HFA) 108 (90 Base) MCG/ACT inhaler, Inhale 2 puffs into the lungs every 6 (six) hours as needed for wheezing or shortness of breath., Disp:  8 g, Rfl: 2   azelastine  (ASTELIN ) 0.1 % nasal spray, Place 2 sprays into both nostrils daily. Use in each  nostril as directed, Disp: 30 mL, Rfl: 12   fluticasone  (FLONASE ) 50 MCG/ACT nasal spray, Place 2 sprays into both nostrils daily., Disp: 16 g, Rfl: 6   ipratropium (ATROVENT ) 0.03 % nasal spray, Place 2 sprays into both nostrils every 12 (twelve) hours., Disp: 30 mL, Rfl: 12   omeprazole  (PRILOSEC) 20 MG capsule, Take 1 capsule (20 mg total) by mouth at bedtime., Disp: 30 capsule, Rfl: 3   sildenafil  (REVATIO ) 20 MG tablet, Take 3-5 tablets (60-100 mg total) by mouth daily as needed., Disp: 50 tablet, Rfl: 11   fluticasone -salmeterol (ADVAIR) 100-50 MCG/ACT AEPB, Inhale 1 puff into the lungs 2 (two) times daily., Disp: 60 each, Rfl: 6
